# Patient Record
Sex: Male | Born: 1975 | Race: White | Hispanic: No | Marital: Married | Smoking: Never smoker
Health system: Southern US, Community
[De-identification: ages and names within clinical notes are randomized; demographics above are authoritative.]

## PROBLEM LIST (undated history)

## (undated) DIAGNOSIS — E66812 Obesity, class 2: Secondary | ICD-10-CM

## (undated) DIAGNOSIS — S332XXA Dislocation of sacroiliac and sacrococcygeal joint, initial encounter: Secondary | ICD-10-CM

## (undated) DIAGNOSIS — J300 Vasomotor rhinitis: Secondary | ICD-10-CM

## (undated) DIAGNOSIS — K219 Gastro-esophageal reflux disease without esophagitis: Secondary | ICD-10-CM

## (undated) DIAGNOSIS — K760 Fatty (change of) liver, not elsewhere classified: Secondary | ICD-10-CM

## (undated) DIAGNOSIS — R7401 Elevation of levels of liver transaminase levels: Secondary | ICD-10-CM

## (undated) DIAGNOSIS — I10 Essential (primary) hypertension: Secondary | ICD-10-CM

## (undated) DIAGNOSIS — N200 Calculus of kidney: Secondary | ICD-10-CM

## (undated) DIAGNOSIS — M109 Gout, unspecified: Secondary | ICD-10-CM

## (undated) DIAGNOSIS — E785 Hyperlipidemia, unspecified: Secondary | ICD-10-CM

## (undated) DIAGNOSIS — Z8371 Family history of colonic polyps: Secondary | ICD-10-CM

## (undated) DIAGNOSIS — E669 Obesity, unspecified: Secondary | ICD-10-CM

## (undated) DIAGNOSIS — R0789 Other chest pain: Secondary | ICD-10-CM

## (undated) DIAGNOSIS — R740 Nonspecific elevation of levels of transaminase and lactic acid dehydrogenase [LDH]: Secondary | ICD-10-CM

## (undated) DIAGNOSIS — R197 Diarrhea, unspecified: Secondary | ICD-10-CM

## (undated) HISTORY — DX: Vasomotor rhinitis: J30.0

## (undated) HISTORY — DX: Fatty (change of) liver, not elsewhere classified: K76.0

## (undated) HISTORY — DX: Diarrhea, unspecified: R19.7

## (undated) HISTORY — DX: Other chest pain: R07.89

## (undated) HISTORY — DX: Gout, unspecified: M10.9

## (undated) HISTORY — DX: Dislocation of sacroiliac and sacrococcygeal joint, initial encounter: S33.2XXA

## (undated) HISTORY — DX: Essential (primary) hypertension: I10

## (undated) HISTORY — DX: Hyperlipidemia, unspecified: E78.5

## (undated) HISTORY — DX: Obesity, class 2: E66.812

## (undated) HISTORY — DX: Obesity, unspecified: E66.9

## (undated) HISTORY — PX: EYE SURGERY: SHX253

## (undated) HISTORY — DX: Calculus of kidney: N20.0

## (undated) HISTORY — DX: Elevation of levels of liver transaminase levels: R74.01

## (undated) HISTORY — PX: COLONOSCOPY: SHX174

## (undated) HISTORY — DX: Gastro-esophageal reflux disease without esophagitis: K21.9

## (undated) HISTORY — PX: WISDOM TOOTH EXTRACTION: SHX21

## (undated) HISTORY — DX: Family history of colonic polyps: Z83.71

## (undated) HISTORY — DX: Nonspecific elevation of levels of transaminase and lactic acid dehydrogenase (ldh): R74.0

---
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
## 2009-01-23 ENCOUNTER — Emergency Department (HOSPITAL_COMMUNITY): Admission: EM | Admit: 2009-01-23 | Discharge: 2009-01-23 | Payer: Self-pay | Admitting: Emergency Medicine

## 2010-07-11 ENCOUNTER — Emergency Department (HOSPITAL_COMMUNITY): Admission: EM | Admit: 2010-07-11 | Discharge: 2010-05-04 | Payer: Self-pay | Admitting: Emergency Medicine

## 2010-10-17 LAB — URINALYSIS, ROUTINE W REFLEX MICROSCOPIC
Glucose, UA: NEGATIVE mg/dL
Hgb urine dipstick: NEGATIVE
Ketones, ur: NEGATIVE mg/dL
Nitrite: NEGATIVE
Protein, ur: NEGATIVE mg/dL
Specific Gravity, Urine: 1.021 (ref 1.005–1.030)
Urobilinogen, UA: 1 mg/dL (ref 0.0–1.0)
pH: 5 (ref 5.0–8.0)

## 2010-10-17 LAB — POCT I-STAT, CHEM 8
BUN: 16 mg/dL (ref 6–23)
Calcium, Ion: 1.23 mmol/L (ref 1.12–1.32)
Chloride: 107 mEq/L (ref 96–112)
Creatinine, Ser: 1.2 mg/dL (ref 0.4–1.5)
Glucose, Bld: 158 mg/dL — ABNORMAL HIGH (ref 70–99)
HCT: 48 % (ref 39.0–52.0)
Hemoglobin: 16.3 g/dL (ref 13.0–17.0)
Potassium: 4.2 mEq/L (ref 3.5–5.1)
Sodium: 140 mEq/L (ref 135–145)
TCO2: 28 mmol/L (ref 0–100)

## 2010-10-17 LAB — DIFFERENTIAL
Basophils Absolute: 0 10*3/uL (ref 0.0–0.1)
Basophils Relative: 0 % (ref 0–1)
Eosinophils Absolute: 0.3 10*3/uL (ref 0.0–0.7)
Eosinophils Relative: 3 % (ref 0–5)
Lymphocytes Relative: 27 % (ref 12–46)
Lymphs Abs: 2.7 10*3/uL (ref 0.7–4.0)
Monocytes Absolute: 0.7 10*3/uL (ref 0.1–1.0)
Monocytes Relative: 7 % (ref 3–12)
Neutro Abs: 6.3 10*3/uL (ref 1.7–7.7)
Neutrophils Relative %: 63 % (ref 43–77)

## 2010-10-17 LAB — CBC
HCT: 44 % (ref 39.0–52.0)
Hemoglobin: 15.9 g/dL (ref 13.0–17.0)
MCH: 30.5 pg (ref 26.0–34.0)
MCHC: 36.1 g/dL — ABNORMAL HIGH (ref 30.0–36.0)
MCV: 84.3 fL (ref 78.0–100.0)
Platelets: 168 10*3/uL (ref 150–400)
RBC: 5.22 MIL/uL (ref 4.22–5.81)
RDW: 12.9 % (ref 11.5–15.5)
WBC: 10 10*3/uL (ref 4.0–10.5)

## 2010-11-11 LAB — BASIC METABOLIC PANEL
BUN: 13 mg/dL (ref 6–23)
CO2: 28 mEq/L (ref 19–32)
Calcium: 9.2 mg/dL (ref 8.4–10.5)
Chloride: 105 mEq/L (ref 96–112)
Creatinine, Ser: 1.13 mg/dL (ref 0.4–1.5)
Glucose, Bld: 152 mg/dL — ABNORMAL HIGH (ref 70–99)
Potassium: 3.8 mEq/L (ref 3.5–5.1)
Sodium: 139 mEq/L (ref 135–145)

## 2010-11-11 LAB — DIFFERENTIAL
Basophils Absolute: 0 10*3/uL (ref 0.0–0.1)
Basophils Relative: 0 % (ref 0–1)
Eosinophils Absolute: 0.3 10*3/uL (ref 0.0–0.7)
Eosinophils Relative: 4 % (ref 0–5)
Lymphocytes Relative: 39 % (ref 12–46)
Lymphs Abs: 3.6 10*3/uL (ref 0.7–4.0)
Monocytes Absolute: 1 10*3/uL (ref 0.1–1.0)
Monocytes Relative: 10 % (ref 3–12)
Neutro Abs: 4.4 10*3/uL (ref 1.7–7.7)
Neutrophils Relative %: 47 % (ref 43–77)

## 2010-11-11 LAB — URINALYSIS, ROUTINE W REFLEX MICROSCOPIC
Bilirubin Urine: NEGATIVE
Glucose, UA: NEGATIVE mg/dL
Ketones, ur: NEGATIVE mg/dL
Leukocytes, UA: NEGATIVE
Nitrite: NEGATIVE
Protein, ur: NEGATIVE mg/dL
Specific Gravity, Urine: 1.03 (ref 1.005–1.030)
Urobilinogen, UA: 0.2 mg/dL (ref 0.0–1.0)
pH: 5.5 (ref 5.0–8.0)

## 2010-11-11 LAB — URINE MICROSCOPIC-ADD ON

## 2010-11-11 LAB — CBC
HCT: 45.7 % (ref 39.0–52.0)
Hemoglobin: 15.6 g/dL (ref 13.0–17.0)
MCHC: 34 g/dL (ref 30.0–36.0)
MCV: 86.8 fL (ref 78.0–100.0)
Platelets: 209 10*3/uL (ref 150–400)
RBC: 5.27 MIL/uL (ref 4.22–5.81)
RDW: 12.6 % (ref 11.5–15.5)
WBC: 9.3 10*3/uL (ref 4.0–10.5)

## 2011-08-30 ENCOUNTER — Ambulatory Visit (INDEPENDENT_AMBULATORY_CARE_PROVIDER_SITE_OTHER): Payer: BC Managed Care – PPO

## 2011-08-30 DIAGNOSIS — J019 Acute sinusitis, unspecified: Secondary | ICD-10-CM

## 2011-08-30 DIAGNOSIS — R05 Cough: Secondary | ICD-10-CM

## 2012-02-12 ENCOUNTER — Ambulatory Visit (INDEPENDENT_AMBULATORY_CARE_PROVIDER_SITE_OTHER): Payer: BC Managed Care – PPO | Admitting: Emergency Medicine

## 2012-02-12 VITALS — BP 142/90 | HR 77 | Temp 97.9°F | Resp 16 | Ht 71.0 in | Wt 231.2 lb

## 2012-02-12 DIAGNOSIS — R05 Cough: Secondary | ICD-10-CM

## 2012-02-12 DIAGNOSIS — J329 Chronic sinusitis, unspecified: Secondary | ICD-10-CM

## 2012-02-12 DIAGNOSIS — J019 Acute sinusitis, unspecified: Secondary | ICD-10-CM

## 2012-02-12 DIAGNOSIS — J029 Acute pharyngitis, unspecified: Secondary | ICD-10-CM

## 2012-02-12 LAB — POCT RAPID STREP A (OFFICE): Rapid Strep A Screen: NEGATIVE

## 2012-02-12 MED ORDER — AMOXICILLIN 875 MG PO TABS: 875.0000 mg | ORAL_TABLET | Freq: Two times a day (BID) | ORAL | Status: AC

## 2012-02-12 NOTE — Progress Notes (Signed)
  Subjective:    Patient ID: Hunter Wiley, male    DOB: 03-Oct-1975, 36 y.o.   MRN: [id number]  HPI patient presents with a two-day history of headache congestion sore throat cough. He denies any fever. No mucus he has grown from his nose has a color to it as well as the sputum he is producing discolored. Had no fever has no history of allergies. He has been prescribed nasal spray in the past but does not take it to    Review of Systems     Objective:   Physical Exam  Constitutional: He appears well-developed and well-nourished.  HENT:       There is purulent nasal drainage.Both TMs are scarred with significant tympanosclerosis  Eyes: Pupils are equal, round, and reactive to light.  Neck: No tracheal deviation present. No thyromegaly present.  Cardiovascular: Normal rate.   Pulmonary/Chest: No respiratory distress. He has no wheezes. He has no rales.  Abdominal: He exhibits no distension. There is no tenderness. There is no rebound.   Results for orders placed in visit on 02/12/12  POCT RAPID STREP A (OFFICE)      Component Value Range   Rapid Strep A Screen Negative  Negative         Assessment & Plan:  Strep test is negative we'll treat as a sinusitis with his purulent nasal drainage and productive cough.

## 2012-02-12 NOTE — Patient Instructions (Signed)

## 2012-04-21 ENCOUNTER — Ambulatory Visit (INDEPENDENT_AMBULATORY_CARE_PROVIDER_SITE_OTHER): Payer: BC Managed Care – PPO | Admitting: Internal Medicine

## 2012-04-21 ENCOUNTER — Encounter: Payer: Self-pay | Admitting: Internal Medicine

## 2012-04-21 VITALS — BP 132/84 | HR 96 | Temp 98.1°F | Resp 12 | Ht 72.3 in | Wt 236.8 lb

## 2012-04-21 DIAGNOSIS — Z23 Encounter for immunization: Secondary | ICD-10-CM

## 2012-04-21 DIAGNOSIS — Z0000 Encounter for general adult medical examination without abnormal findings: Secondary | ICD-10-CM

## 2012-04-21 DIAGNOSIS — Z136 Encounter for screening for cardiovascular disorders: Secondary | ICD-10-CM

## 2012-04-21 NOTE — Patient Instructions (Addendum)
Preventive Health Care: Exercise at least 30-45 minutes a day,  3-4 days a week.  Eat a low-fat diet with lots of fruits and vegetables, up to 7-9 servings per day.  Consume less than 40 grams of sugar per day from foods & drinks with High Fructose Corn Sugar as # 1,2,3 or # 4 on label. Use tissue to cleanse his stool; after bowel movements clean with TUCKS  or Baby Wipes. Sitz baths followed by the  Medication 2 to 3 times a day to shrink the hemorrhoids. Stay well hydrated and avoid popcorn and some other materials which might aggravate hemorrhoids.  The triggers for reflux  include stress; the "aspirin family" ; alcohol; peppermint; and caffeine (coffee, tea, cola, and chocolate). The aspirin family would include aspirin and the nonsteroidal agents such as ibuprofen &  Naproxen.Arthritis Strength Tylenol would not cause reflux. If having symptoms ; food & drink should be avoided for @ least 2 hours before going to bed. Prilosec OTC pre breakfast as needed. EKG is normal but there are minor ST-T changes of early repolarization. These are normal variants but could be mistaken for acute injury. This EKG should be available for comparison if  seen emergently.  Please  schedule fasting Labs : BMET,Lipids, hepatic panel, CBC & dif, TSH. PLEASE BRING THESE INSTRUCTIONS TO FOLLOW UP  LAB APPOINTMENT.This will guarantee correct labs are drawn, eliminating need for repeat blood sampling ( needle sticks ! ). Diagnoses /Codes: V70.0. If you activate My Chart; the results can be released to you as soon as they populate from the lab. If you choose not to use this program; the labs have to be reviewed, copied & mailed causing a delay in getting the results to you.

## 2012-04-21 NOTE — Progress Notes (Signed)
  Subjective:    Patient ID: Hunter Wiley, male    DOB: 02-Apr-1976, 36 y.o.   MRN: [id number]  HPI  Hunter Wiley is here for a physical;acute issues are delineated below     Review of Systems  For the last month he's noted intermittent "tingling" lasting seconds or less while at rest. There was no predisposition such as a respiratory tract infection or injury prior to onset of the symptoms. These are nonexertional; his only exertion is playing golf.  He denies significant chest pain, dyspnea, palpitations, edema, claudications, or paroxysmal nocturnal dyspnea.   He's been seen by [facility] for dislocation of the coccyx for which he takes Aleve several times a week.  His increase in dose of Aleve did precede this atypical chest sensation . He denies hoarseness, dysphagia, abdominal pain, unexplained weight loss, melena, or rectal bleeding.  He denies cough, sputum production, hemoptysis, or wheezing.     Objective:   Physical Exam  Gen.: Healthy and well-nourished in appearance. Alert, appropriate and cooperative throughout exam. Head: Normocephalic without obvious abnormalities  Eyes: No corneal or conjunctival inflammation noted. Pupils equal round reactive to light and accommodation. Fundal exam is benign without hemorrhages, exudate, papilledema. Extraocular motion intact.  Ears: External  ear exam reveals no significant lesions or deformities. Canals clear .TMs scarred. Hearing is grossly normal bilaterally. Nose: External nasal exam reveals no deformity or inflammation. Nasal mucosa are pink and moist. No lesions or exudates noted.  Mouth: Oral mucosa and oropharynx reveal no lesions or exudates. Teeth in good repair. Neck: No deformities, masses, or tenderness noted. Range of motion & Thyroid normal Lungs: Normal respiratory effort; chest expands symmetrically. Lungs are clear to auscultation without rales, wheezes, or increased work of breathing. Heart: Normal rate and rhythm.  Normal S1 and S2. No gallop, click, or rub. S4 w/o murmur. Abdomen: Bowel sounds normal; abdomen soft and nontender. No masses, organomegaly or hernias noted. Genitalia normal except for left varices.External hemorrhoids. Prostate is normal without enlargement, asymmetry, nodularity, or induration.  Musculoskeletal/extremities: No deformity or scoliosis noted of  the thoracic or lumbar spine. No clubbing, cyanosis, edema, or deformity noted. Range of motion  normal .Tone & strength  normal.Joints normal. Nail health  good. Vascular: Carotid, radial artery, dorsalis pedis and  posterior tibial pulses are full and equal. No bruits present. Neurologic: Alert and oriented x3. Deep tendon reflexes symmetrical and normal.          Skin: Intact without suspicious lesions or rashes. Lymph: No cervical, axillary, or inguinal lymphadenopathy present. Psych: Mood and affect are normal. Normally interactive                                                                                         Assessment & Plan:  #1 comprehensive physical exam; no acute findings #2 atypical chest symptoms are most likely due to reflux in the context of increased use of Aleve. If symptoms persist or progress; stress testing would be indicated Plan: see Orders

## 2012-05-04 ENCOUNTER — Other Ambulatory Visit (INDEPENDENT_AMBULATORY_CARE_PROVIDER_SITE_OTHER): Payer: BC Managed Care – PPO

## 2012-05-04 DIAGNOSIS — Z0000 Encounter for general adult medical examination without abnormal findings: Secondary | ICD-10-CM

## 2012-05-04 LAB — CBC WITH DIFFERENTIAL/PLATELET
Basophils Absolute: 0 10*3/uL (ref 0.0–0.1)
Basophils Relative: 0.4 % (ref 0.0–3.0)
Eosinophils Absolute: 0.2 10*3/uL (ref 0.0–0.7)
HCT: 44.2 % (ref 39.0–52.0)
Hemoglobin: 15.1 g/dL (ref 13.0–17.0)
Lymphocytes Relative: 27.4 % (ref 12.0–46.0)
Lymphs Abs: 2.2 10*3/uL (ref 0.7–4.0)
MCHC: 34.1 g/dL (ref 30.0–36.0)
MCV: 87.8 fl (ref 78.0–100.0)
Monocytes Absolute: 0.6 10*3/uL (ref 0.1–1.0)
Monocytes Relative: 8 % (ref 3.0–12.0)
Neutro Abs: 5 10*3/uL (ref 1.4–7.7)
Neutrophils Relative %: 61.7 % (ref 43.0–77.0)
RBC: 5.04 Mil/uL (ref 4.22–5.81)
RDW: 13.5 % (ref 11.5–14.6)

## 2012-05-04 LAB — BASIC METABOLIC PANEL
BUN: 16 mg/dL (ref 6–23)
CO2: 28 mEq/L (ref 19–32)
Calcium: 9.2 mg/dL (ref 8.4–10.5)
Chloride: 106 mEq/L (ref 96–112)
Creatinine, Ser: 1.1 mg/dL (ref 0.4–1.5)
GFR: 82.13 mL/min (ref >60.00)
Glucose, Bld: 92 mg/dL (ref 70–99)
Potassium: 4.3 mEq/L (ref 3.5–5.1)
Sodium: 139 mEq/L (ref 135–145)

## 2012-05-04 LAB — HEPATIC FUNCTION PANEL
ALT: 39 U/L (ref 0–53)
Albumin: 4.3 g/dL (ref 3.5–5.2)
Alkaline Phosphatase: 39 U/L (ref 39–117)
Bilirubin, Direct: 0.2 mg/dL (ref 0.0–0.3)
Total Bilirubin: 1.3 mg/dL — ABNORMAL HIGH (ref 0.3–1.2)
Total Protein: 7.2 g/dL (ref 6.0–8.3)

## 2012-05-04 LAB — LIPID PANEL
Cholesterol: 183 mg/dL (ref 0–200)
HDL: 36.1 mg/dL — ABNORMAL LOW (ref >39.00)
LDL Cholesterol: 118 mg/dL — ABNORMAL HIGH (ref 0–99)
Total CHOL/HDL Ratio: 5
VLDL: 29.2 mg/dL (ref 0.0–40.0)

## 2012-08-04 HISTORY — PX: VASECTOMY: SHX75

## 2013-03-22 ENCOUNTER — Encounter: Payer: Self-pay | Admitting: *Deleted

## 2013-03-22 ENCOUNTER — Emergency Department
Admission: EM | Admit: 2013-03-22 | Discharge: 2013-03-22 | Disposition: A | Discharge status: Home / Self Care | Payer: BC Managed Care – PPO | Source: Home / Self Care | Attending: Emergency Medicine | Admitting: Emergency Medicine

## 2013-03-22 DIAGNOSIS — R059 Cough, unspecified: Secondary | ICD-10-CM

## 2013-03-22 DIAGNOSIS — J45909 Unspecified asthma, uncomplicated: Secondary | ICD-10-CM

## 2013-03-22 DIAGNOSIS — R05 Cough: Secondary | ICD-10-CM

## 2013-03-22 MED ORDER — PROMETHAZINE-CODEINE 6.25-10 MG/5ML PO SYRP: ORAL_SOLUTION | ORAL | Status: DC

## 2013-03-22 MED ORDER — AZITHROMYCIN 250 MG PO TABS: ORAL_TABLET | ORAL | Status: DC

## 2013-03-22 MED ORDER — PREDNISONE 20 MG PO TABS: 20.0000 mg | ORAL_TABLET | Freq: Two times a day (BID) | ORAL | Status: DC

## 2013-03-22 NOTE — ED Provider Notes (Signed)
CSN: [id number]     Arrival date & time 03/22/13  4098 History     First MD Initiated Contact with Patient 03/22/13 1000     Chief Complaint  Patient presents with  . Cough    Patient is a 37 y.o. male presenting with cough. The history is provided by the patient.  Cough Cough characteristics:  Harsh, hacking and non-productive Severity:  Moderate (severe at bedtime) Duration:  6 days Timing:  Intermittent Progression:  Worsening Chronicity:  New (although he seems to get this one once a year around this time ) Smoker: no   Context: weather changes   Context: not animal exposure, not occupational exposure and not sick contacts   Relieved by:  Nothing Ineffective treatments: OTC cough med. Associated symptoms: rhinorrhea (minimal, serous) and wheezing (mild at times)   Associated symptoms: no chest pain, no chills, no ear pain, no fever, no headaches, no myalgias, no shortness of breath, no sinus congestion, no sore throat and no weight loss   Risk factors: no chemical exposure, no recent infection and no recent travel     Past Medical History  Diagnosis Date  . Dislocation, coccyx closed     [facility] [facility]   Past Surgical History  Procedure Laterality Date  . Tonsillectomy and adenoidectomy  1980  . Wisdom tooth extraction    . Vasectomy    . Eye surgery     Family History  Problem Relation Age of Onset  . Lung cancer Maternal Grandfather     mesothelioma  . Hypertension Mother   . Diabetes Mother   . Heart attack Mother     Normal coronaries @ cath  . Hypertension Father   . Heart attack Paternal Grandfather     in 102s   History  Substance Use Topics  . Smoking status: Never Smoker   . Smokeless tobacco: Not on file  . Alcohol Use: No    Review of Systems  Constitutional: Negative for fever, chills and weight loss.  HENT: Positive for rhinorrhea (minimal, serous). Negative for ear pain and sore throat.   Respiratory: Positive for cough and wheezing (mild at  times). Negative for shortness of breath.   Cardiovascular: Negative for chest pain.  Musculoskeletal: Negative for myalgias.  Neurological: Negative for headaches.  All other systems reviewed and are negative.    Allergies  Review of patient's allergies indicates no known allergies.  Home Medications   Current Outpatient Rx  Name  Route  Sig  Dispense  Refill  . azithromycin (ZITHROMAX Z-PAK) 250 MG tablet      Use as directed   1 each   0   . predniSONE (DELTASONE) 20 MG tablet   Oral   Take 1 tablet (20 mg total) by mouth 2 (two) times daily.   10 tablet   0   . promethazine-codeine (PHENERGAN WITH CODEINE) 6.25-10 MG/5ML syrup      Take 1-2 teaspoons every 4-6 hours as needed for cough. May cause drowsiness.   120 mL   0    BP 133/88  Pulse 62  Temp(Src) 97.9 F (36.6 C) (Oral)  Resp 18  Ht 5\' 11"  (1.803 m)  Wt 220 lb (99.791 kg)  BMI 30.7 kg/m2  SpO2 97% Physical Exam  Nursing note and vitals reviewed. Constitutional: He is oriented to person, place, and time. He appears well-developed and well-nourished. No distress.  HENT:  Head: Normocephalic and atraumatic.  Right Ear: Tympanic membrane normal.  Left Ear: Tympanic membrane  normal.  Nose: Nose normal.  Mouth/Throat: Oropharynx is clear and moist. No oropharyngeal exudate.  Eyes: Right eye exhibits no discharge. Left eye exhibits no discharge. No scleral icterus.  Neck: Neck supple.  Cardiovascular: Normal rate, regular rhythm and normal heart sounds.   Pulmonary/Chest: No accessory muscle usage. No respiratory distress. He has wheezes (mild wheezing and forced expiration). He has rhonchi. He has no rales. He exhibits no tenderness.  Hacking cough noted at times. No rhonchi  Good air movement  Lymphadenopathy:    He has no cervical adenopathy.  Neurological: He is alert and oriented to person, place, and time.  Skin: Skin is warm and dry.    ED Course   Procedures (including critical care  time)  Labs Reviewed - No data to display No results found. 1. Cough   2. Allergic bronchitis     MDM  Discussed above diagnosis. and treatment options. No evidence for infection at this time. After risk, benefits, alternativs discussed, he agrees with the following plans: Prednisone 20 mg PO BID times five days. Phenergan with codeine cough syrup, one or 2 teaspoons HS. Precautions and red flags discussed. If he develops productive cough or fever, go ahead and fill prescription for Zithromax Z Pak. Other symptomatic care discussed. Follow-up with PCP if not better one week, sooner if worse or new symptoms  Lajean Manes, MD 03/22/13 1711

## 2013-03-22 NOTE — ED Notes (Signed)
Pt c/o mostly non producitve cough x 5-6 days. Denies fever. No OTC meds.

## 2013-07-06 ENCOUNTER — Emergency Department (INDEPENDENT_AMBULATORY_CARE_PROVIDER_SITE_OTHER)
Admission: EM | Admit: 2013-07-06 | Discharge: 2013-07-06 | Disposition: A | Discharge status: Home / Self Care | Payer: BC Managed Care – PPO | Source: Home / Self Care | Attending: Family Medicine | Admitting: Family Medicine

## 2013-07-06 ENCOUNTER — Encounter: Payer: Self-pay | Admitting: Emergency Medicine

## 2013-07-06 DIAGNOSIS — J069 Acute upper respiratory infection, unspecified: Secondary | ICD-10-CM

## 2013-07-06 MED ORDER — AZITHROMYCIN 250 MG PO TABS: ORAL_TABLET | ORAL | Status: DC

## 2013-07-06 MED ORDER — BENZONATATE 200 MG PO CAPS: 200.0000 mg | ORAL_CAPSULE | Freq: Every day | ORAL | Status: DC

## 2013-07-06 NOTE — ED Notes (Signed)
Colm c/o nasal congestion and cough, productive @ times. Denies fever. No flu vaccine this year.

## 2013-07-06 NOTE — ED Provider Notes (Signed)
CSN: [id number]     Arrival date & time 07/06/13  0807 History   First MD Initiated Contact with Patient 07/06/13 [phone]     Chief Complaint  Patient presents with  . Nasal Congestion  . Cough      HPI Comments: Patient complains of 5 day history of scratchy throat, sinus congestion and myalgias.  He developed a productive cough last night.  The history is provided by the patient.    Past Medical History  Diagnosis Date  . Dislocation, coccyx closed     [facility]   Past Surgical History  Procedure Laterality Date  . Tonsillectomy and adenoidectomy  1980  . Wisdom tooth extraction    . Vasectomy    . Eye surgery     Family History  Problem Relation Age of Onset  . Lung cancer Maternal Grandfather     mesothelioma  . Hypertension Mother   . Diabetes Mother   . Heart attack Mother     Normal coronaries @ cath  . Hypertension Father   . Heart attack Paternal Grandfather     in 59s   History  Substance Use Topics  . Smoking status: Never Smoker   . Smokeless tobacco: Never Used  . Alcohol Use: No    Review of Systems + sore throat + cough No pleuritic pain No wheezing + nasal congestion + post-nasal drainage No sinus pain/pressure No itchy/red eyes No earache No hemoptysis No SOB No fever/chills No nausea No vomiting No abdominal pain No diarrhea No urinary symptoms No skin rash + fatigue No myalgias No headache Used OTC meds without relief  Allergies  Review of patient's allergies indicates no known allergies.  Home Medications   Current Outpatient Rx  Name  Route  Sig  Dispense  Refill  . azithromycin (ZITHROMAX Z-PAK) 250 MG tablet      Take 2 tabs today; then begin one tab once daily for 4 more days. (Rx void after 07/14/13)   6 each   0   . benzonatate (TESSALON) 200 MG capsule   Oral   Take 1 capsule (200 mg total) by mouth at bedtime. Take as needed for cough   12 capsule   0    BP 135/81  Pulse 81  Temp(Src) 97.6 F (36.4 C)  (Oral)  Resp 14  Ht 5\' 11"  (1.803 m)  Wt 233 lb (105.688 kg)  BMI 32.51 kg/m2  SpO2 99% Physical Exam Nursing notes and Vital Signs reviewed. Appearance:  Patient appears healthy, stated age, and in no acute distress Eyes:  Pupils are equal, round, and reactive to light and accomodation.  Extraocular movement is intact.  Conjunctivae are not inflamed  Ears:  Canals normal.  Tympanic membranes normal.  Nose:  Mildly congested turbinates.  No sinus tenderness.  Pharynx:  Normal Neck:  Supple.  Tender shotty posterior nodes are palpated bilaterally  Lungs:  Clear to auscultation.  Breath sounds are equal.  Heart:  Regular rate and rhythm without murmurs, rubs, or gallops.  Abdomen:  Nontender without masses or hepatosplenomegaly.  Bowel sounds are present.  No CVA or flank tenderness.  Extremities:  No edema.  No calf tenderness Skin:  No rash present.   ED Course  Procedures  none       MDM   1. Acute upper respiratory infections of unspecified site; suspect early viral URI    There is no evidence of bacterial infection today.   Treat symptomatically for now  Prescription written  for Benzonatate (Tessalon) to take at bedtime for night-time cough.  Take Mucinex D (1200mg  guaifenesin with decongestant) twice daily for congestion.  Increase fluid intake, rest. May use Afrin nasal spray (or generic oxymetazoline) twice daily for about 5 days.  Also recommend using saline nasal spray several times daily and saline nasal irrigation (AYR is a common brand)  Stop all antihistamines for now, and other non-prescription cough/cold preparations. Begin Azithromycin if not improving about one week or if persistent fever develops (Given a prescription to hold, with an expiration date)  Follow-up with family doctor if not improving about10 days.     Lattie Haw, MD 07/10/13 [phone]

## 2014-04-07 ENCOUNTER — Ambulatory Visit (INDEPENDENT_AMBULATORY_CARE_PROVIDER_SITE_OTHER): Payer: BC Managed Care – PPO | Admitting: Family Medicine

## 2014-04-07 ENCOUNTER — Encounter: Payer: Self-pay | Admitting: Family Medicine

## 2014-04-07 VITALS — BP 142/90 | HR 68 | Temp 97.9°F | Resp 18 | Ht 71.0 in | Wt 225.0 lb

## 2014-04-07 DIAGNOSIS — R209 Unspecified disturbances of skin sensation: Secondary | ICD-10-CM

## 2014-04-07 DIAGNOSIS — R202 Paresthesia of skin: Secondary | ICD-10-CM

## 2014-04-07 NOTE — Progress Notes (Signed)
Office Note 04/09/2014  CC:  Chief Complaint  Patient presents with  . Establish Care  . Arm Pain    Left arm numbness x 2 days   HPI:  Hunter Wiley is a 38 y.o. White male who is here to establish care. Patient's most recent primary MD: Dr. Alwyn Ren. Old records in EPIC/[facility] EMR were reviewed prior to or during today's visit.  Onset a few days ago of numb/strange feeling on underside of left upper arm.  It is intermittent.  No arm pain. NO jaw pain, no SOB.  No known trigger or odd positioning of left arm recently.   No neck pain.  Has distant hx of seeing chiropracter ( a decade ago?)--says something about a neck x-ray being abnormal but doesn't recall details.  No other areas of his body have felt this way. No exercise except golfing several days a week.  Last labs 05/2012 wnl.  Past Medical History  Diagnosis Date  . Dislocation, coccyx closed     [facility]  . Elevated blood pressure reading without diagnosis of hypertension   . Nephrolithiasis     Past Surgical History  Procedure Laterality Date  . Tonsillectomy and adenoidectomy  1980  . Wisdom tooth extraction    . Vasectomy    . Eye surgery      Lasik    Family History  Problem Relation Age of Onset  . Lung cancer Maternal Grandfather     mesothelioma  . Hypertension Mother   . Diabetes Mother   . Heart attack Mother     Normal coronaries @ cath  . Hypertension Father   . Heart attack Paternal Grandfather     in 33s    History   Social History  . Marital Status: Married    Spouse Name: N/A    Number of Children: N/A  . Years of Education: N/A   Occupational History  . Not on file.   Social History Main Topics  . Smoking status: Never Smoker   . Smokeless tobacco: Never Used  . Alcohol Use: No  . Drug Use: No  . Sexual Activity: Not on file   Other Topics Concern  . Not on file   Social History Narrative   Married, 2 sons.   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   Lives in [place].   No T/A/Ds.   No FH of prostate or colon cancer   MEDS: none  No Known Allergies  ROS Review of Systems  Constitutional: Negative for fever and fatigue.  HENT: Negative for congestion and sore throat.   Eyes: Negative for visual disturbance.  Respiratory: Negative for cough.   Cardiovascular: Negative for chest pain.  Gastrointestinal: Negative for nausea and abdominal pain.  Genitourinary: Negative for dysuria.  Musculoskeletal: Negative for back pain and joint swelling.  Skin: Negative for rash.  Neurological: Negative for weakness and headaches.  Hematological: Negative for adenopathy.    PE; Blood pressure 142/90, pulse 68, temperature 97.9 F (36.6 C), temperature source Temporal, resp. rate 18, height  (1.803 m), weight 225 lb (102.059 kg), SpO2 96.00%. Gen: Alert, well appearing.  Patient is oriented to person, place, time, and situation. ENT: Ears: EACs clear, normal epithelium.  TMs with good light reflex and landmarks bilaterally.  Eyes: no injection, icteris, swelling, or exudate.  EOMI, PERRLA. Nose: no drainage or turbinate edema/swelling.  No injection or focal lesion.  Mouth: lips without lesion/swelling.  Oral mucosa pink and moist.  Dentition intact and  without obvious caries or gingival swelling.  Oropharynx without erythema, exudate, or swelling.  Neck - No masses or thyromegaly or limitation in range of motion CV: RRR, no m/r/g.   LUNGS: CTA bilat, nonlabored resps, good aeration in all lung fields. EXT: no clubbing, cyanosis, or edema.  Neuro: CN 2-12 intact bilaterally, strength 5/5 in proximal and distal upper extremities and lower extremities bilaterally.  No sensory deficits (monofilament testing and testing with cotton swab done on upper extremeties..  No tremor.  No disdiadochokinesis.  No ataxia.  Upper extremity and lower extremity DTRs symmetric.  No pronator drift. Musculoskeletal: no joint swelling, erythema, warmth, or tenderness.  ROM of all  joints intact.  Spurling's negative.  Pertinent labs:  none  ASSESSMENT AND PLAN:   New pt; no old records to obtain.  1) Left arm focal numbness sensation intermittently x approx 3d. Normal exam today.  No red flags for intracranial pathology or CV dz. Reassured pt.  He may have a mild brachial plexus stretch-type injury from golfing. Signs/symptoms to call or return for were reviewed and pt expressed understanding.  An After Visit Summary was printed and given to the patient.  Return for return at your convenience for fasting CPE.

## 2014-04-07 NOTE — Progress Notes (Signed)
Pre visit review using our clinic review tool, if applicable. No additional management support is needed unless otherwise documented below in the visit note. 

## 2015-03-05 DIAGNOSIS — R197 Diarrhea, unspecified: Secondary | ICD-10-CM

## 2015-03-05 HISTORY — DX: Diarrhea, unspecified: R19.7

## 2015-03-15 LAB — HEPATIC FUNCTION PANEL
ALT: 60 U/L — AB (ref 10–40)
AST: 29 U/L (ref 14–40)
Alkaline Phosphatase: 44 U/L (ref 25–125)
Bilirubin, Total: 0.6 mg/dL

## 2015-03-15 LAB — BASIC METABOLIC PANEL
BUN: 15 mg/dL (ref 4–21)
CREATININE: 1.2 mg/dL (ref 0.6–1.3)
Glucose: 87 mg/dL
Potassium: 4.1 mmol/L (ref 3.4–5.3)
Sodium: 139 mmol/L (ref 137–147)

## 2015-03-15 LAB — CBC AND DIFFERENTIAL
HCT: 44 % (ref 41–53)
Hemoglobin: 15.1 g/dL (ref 13.5–17.5)
Neutrophils Absolute: 5 /uL
Platelets: 164 10*3/uL (ref 150–399)
WBC: 8.7 10^3/mL

## 2015-03-15 LAB — TSH: TSH: 2.45 u[IU]/mL (ref 0.41–5.90)

## 2015-03-18 ENCOUNTER — Encounter: Payer: Self-pay | Admitting: Family Medicine

## 2015-04-02 ENCOUNTER — Encounter: Payer: Self-pay | Admitting: Family Medicine

## 2015-09-07 ENCOUNTER — Encounter: Payer: Self-pay | Admitting: Family Medicine

## 2015-09-07 ENCOUNTER — Ambulatory Visit (INDEPENDENT_AMBULATORY_CARE_PROVIDER_SITE_OTHER): Payer: BC Managed Care – PPO | Admitting: Family Medicine

## 2015-09-07 VITALS — BP 148/112 | HR 74 | Temp 98.0°F | Resp 16 | Ht 71.0 in | Wt 244.5 lb

## 2015-09-07 DIAGNOSIS — I10 Essential (primary) hypertension: Secondary | ICD-10-CM | POA: Diagnosis not present

## 2015-09-07 DIAGNOSIS — Z114 Encounter for screening for human immunodeficiency virus [HIV]: Secondary | ICD-10-CM | POA: Diagnosis not present

## 2015-09-07 DIAGNOSIS — Z0000 Encounter for general adult medical examination without abnormal findings: Secondary | ICD-10-CM | POA: Diagnosis not present

## 2015-09-07 LAB — COMPREHENSIVE METABOLIC PANEL
ALK PHOS: 48 U/L (ref 39–117)
ALT: 54 U/L — ABNORMAL HIGH (ref 0–53)
AST: 31 U/L (ref 0–37)
Albumin: 4.6 g/dL (ref 3.5–5.2)
BILIRUBIN TOTAL: 1 mg/dL (ref 0.2–1.2)
BUN: 11 mg/dL (ref 6–23)
CO2: 32 meq/L (ref 19–32)
Calcium: 10 mg/dL (ref 8.4–10.5)
Chloride: 102 mEq/L (ref 96–112)
Creatinine, Ser: 1.07 mg/dL (ref 0.40–1.50)
GFR: 80.74 mL/min (ref >60.00)
Glucose, Bld: 83 mg/dL (ref 70–99)
Potassium: 4.2 mEq/L (ref 3.5–5.1)
Sodium: 140 mEq/L (ref 135–145)
Total Protein: 7.3 g/dL (ref 6.0–8.3)

## 2015-09-07 LAB — LIPID PANEL
CHOLESTEROL: 205 mg/dL — AB (ref 0–200)
HDL: 37.1 mg/dL — ABNORMAL LOW (ref >39.00)
LDL Cholesterol: 136 mg/dL — ABNORMAL HIGH (ref 0–99)
NonHDL: 167.86
Total CHOL/HDL Ratio: 6
Triglycerides: 157 mg/dL — ABNORMAL HIGH (ref 0.0–149.0)
VLDL: 31.4 mg/dL (ref 0.0–40.0)

## 2015-09-07 MED ORDER — IRBESARTAN 150 MG PO TABS: 150.0000 mg | ORAL_TABLET | Freq: Every day | ORAL | Status: DC

## 2015-09-07 NOTE — Progress Notes (Signed)
Office Note 09/07/2015  CC:  Chief Complaint  Patient presents with  . Annual Exam    Pt is fasting.    HPI:  Hunter Wiley is a 40 y.o. White male who is here for annual health maintenance exam. Voices that he has general/chronic worry about his heart.  Thus, when he has any sx's in left shoulder or arm he gets worried.   No exertional CP.  No diaphoresis or nausea. Plays golf and lifts a lot a work but no formal exercise. Diet: "regular american".    Says his bp has often been high in MD offices, no bp checks outside of office. Was on "a bp med from an UC MD for about 2 weeks once".   Past Medical History  Diagnosis Date  . Dislocation, coccyx closed     [facility]  . Elevated blood pressure reading without diagnosis of hypertension   . Nephrolithiasis   . Diarrhea 03/2015    GI eval (Dr. Dulce Sellar, [facility]) ?malabsorptive syndrome?  Work-up pending  . Family history of colonic polyps     father    Past Surgical History  Procedure Laterality Date  . Tonsillectomy and adenoidectomy  1980  . Wisdom tooth extraction    . Vasectomy  2014  . Eye surgery      Lasik    Family History  Problem Relation Age of Onset  . Lung cancer Maternal Grandfather     mesothelioma  . Hypertension Mother   . Diabetes Mother   . Heart attack Mother     Normal coronaries @ cath  . Hypertension Father   . Heart attack Paternal Grandfather     in 64s    Social History   Social History  . Marital Status: Married    Spouse Name: N/A  . Number of Children: N/A  . Years of Education: N/A   Occupational History  . Not on file.   Social History Main Topics  . Smoking status: Never Smoker   . Smokeless tobacco: Never Used  . Alcohol Use: No  . Drug Use: No  . Sexual Activity: Not on file   Other Topics Concern  . Not on file   Social History Narrative   Married, 2 sons.   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   Lives in [place].   No T/A/Ds.   No FH of prostate or colon  cancer    No outpatient prescriptions prior to visit.   No facility-administered medications prior to visit.    No Known Allergies  ROS Review of Systems  Constitutional: Negative for fever, chills, appetite change and fatigue.  HENT: Negative for congestion, dental problem, ear pain and sore throat.   Eyes: Negative for discharge, redness and visual disturbance.  Respiratory: Negative for cough, chest tightness, shortness of breath and wheezing.   Cardiovascular: Negative for chest pain, palpitations and leg swelling.  Gastrointestinal: Negative for nausea, vomiting, abdominal pain, diarrhea and blood in stool.  Genitourinary: Negative for dysuria, urgency, frequency, hematuria, flank pain and difficulty urinating.  Musculoskeletal: Negative for myalgias, back pain, joint swelling, arthralgias and neck stiffness.  Skin: Negative for pallor and rash.  Neurological: Negative for dizziness, speech difficulty, weakness and headaches.  Hematological: Negative for adenopathy. Does not bruise/bleed easily.  Psychiatric/Behavioral: Negative for confusion and sleep disturbance. The patient is not nervous/anxious.     PE; Blood pressure 148/112, pulse 74, temperature 98 F (36.7 C), temperature source Oral, resp. rate 16, height  (  1.803 m), weight 244 lb 8 oz (110.904 kg), SpO2 96 %. Repeat bp manually was 148/112. Gen: Alert, well appearing.  Patient is oriented to person, place, time, and situation. AFFECT: pleasant, lucid thought and speech. ENT: Ears: EACs clear, normal epithelium.  TMs with good light reflex and landmarks bilaterally.  Eyes: no injection, icteris, swelling, or exudate.  EOMI, PERRLA. Nose: no drainage or turbinate edema/swelling.  No injection or focal lesion.  Mouth: lips without lesion/swelling.  Oral mucosa pink and moist.  Dentition intact and without obvious caries or gingival swelling.  Oropharynx without erythema, exudate, or swelling.  Neck:  supple/nontender.  No LAD, mass, or TM.  Carotid pulses 2+ bilaterally, without bruits. CV: RRR, no m/r/g.   No chest wall tenderness. LUNGS: CTA bilat, nonlabored resps, good aeration in all lung fields. ABD: soft, NT, ND, BS normal.  No hepatospenomegaly or mass.  No bruits. EXT: no clubbing, cyanosis, or edema.  Musculoskeletal: no joint swelling, erythema, warmth, or tenderness.  ROM of all joints intact. Skin - no sores or suspicious lesions or rashes or color changes  Pertinent labs:   12 lead EKG today: NSR, rate 64, no ischemic changes, no hypertrophy, no ectopy.  Lab Results  Component Value Date   TSH 2.45 03/15/2015   Lab Results  Component Value Date   WBC 8.7 03/15/2015   HGB 15.1 03/15/2015   HCT 44 03/15/2015   MCV 87.8 05/04/2012   PLT 164 03/15/2015   Lab Results  Component Value Date   CREATININE 1.2 03/15/2015   BUN 15 03/15/2015   NA 139 03/15/2015   K 4.1 03/15/2015   CL 106 05/04/2012   CO2 28 05/04/2012   Lab Results  Component Value Date   ALT 60* 03/15/2015   AST 29 03/15/2015   ALKPHOS 44 03/15/2015   BILITOT 1.3* 05/04/2012   Lab Results  Component Value Date   CHOL 183 05/04/2012   Lab Results  Component Value Date   HDL 36.10* 05/04/2012   Lab Results  Component Value Date   LDLCALC 118* 05/04/2012   Lab Results  Component Value Date   TRIG 146.0 05/04/2012   Lab Results  Component Value Date   CHOLHDL 5 05/04/2012    ASSESSMENT AND PLAN:   1) Essential HTN; "new" dx.   EKG normal. Encouraged pt to monitor bp at home/get home bp cuff--check daily for now. Bring numbers in for review at next o/v in 2 weeks. Start irbesartan  qd today.    2) Health maintenance exam: Reviewed age and gender appropriate health maintenance issues (prudent diet, regular exercise, health risks of tobacco and excessive alcohol, use of seatbelts, fire alarms in home, use of sunscreen).  Also reviewed age and gender appropriate health  screening as well as vaccine recommendations. Tdap today.  He declined flu vaccine. HIV screening + fasting lipids and CMET today.  CBC and TSH normal 03/2015--no repeat needed at this time.  An After Visit Summary was printed and given to the patient.  FOLLOW UP:  Return in about 2 weeks (around 09/21/2015) for f/u HTN.

## 2015-09-07 NOTE — Progress Notes (Signed)
Pre visit review using our clinic review tool, if applicable. No additional management support is needed unless otherwise documented below in the visit note. 

## 2015-09-08 LAB — HIV ANTIBODY (ROUTINE TESTING W REFLEX): HIV: NONREACTIVE

## 2015-09-10 ENCOUNTER — Encounter: Payer: Self-pay | Admitting: Family Medicine

## 2015-09-19 ENCOUNTER — Ambulatory Visit (INDEPENDENT_AMBULATORY_CARE_PROVIDER_SITE_OTHER): Payer: BC Managed Care – PPO | Admitting: Family Medicine

## 2015-09-19 ENCOUNTER — Encounter: Payer: Self-pay | Admitting: Family Medicine

## 2015-09-19 VITALS — BP 149/92 | HR 68 | Temp 97.9°F | Resp 16 | Ht 71.0 in | Wt 249.0 lb

## 2015-09-19 DIAGNOSIS — Z23 Encounter for immunization: Secondary | ICD-10-CM | POA: Diagnosis not present

## 2015-09-19 DIAGNOSIS — I10 Essential (primary) hypertension: Secondary | ICD-10-CM | POA: Diagnosis not present

## 2015-09-19 MED ORDER — AMLODIPINE BESY-BENAZEPRIL HCL 10-40 MG PO CAPS: 1.0000 | ORAL_CAPSULE | Freq: Every day | ORAL | Status: DC

## 2015-09-19 NOTE — Progress Notes (Signed)
Pre visit review using our clinic review tool, if applicable. No additional management support is needed unless otherwise documented below in the visit note. 

## 2015-09-19 NOTE — Progress Notes (Signed)
OFFICE VISIT  09/19/2015   CC:  Chief Complaint  Patient presents with  . Follow-up    HTN. Pt is not fasting.    HPI:    Patient is a 40 y.o. Caucasian male who presents for 2 week f/u HTN. Tolerating med fine. Home bp checks still mostly stage 2 syst and diast, occ stage 1 reading, no normal readings. No HA, CP, SOB, palp's, dizziness, or LE edema.  Past Medical History  Diagnosis Date  . Dislocation, coccyx closed     [facility]  . HTN (hypertension)   . Nephrolithiasis   . Diarrhea 03/2015    GI eval (Dr. Dulce Sellar, [facility]) ?malabsorptive syndrome?  Work-up pending  . Family history of colonic polyps     father    Past Surgical History  Procedure Laterality Date  . Tonsillectomy and adenoidectomy  1980  . Wisdom tooth extraction    . Vasectomy  2014  . Eye surgery      Lasik    Outpatient Prescriptions Prior to Visit  Medication Sig Dispense Refill  . irbesartan (AVAPRO) 150 MG tablet Take 1 tablet (150 mg total) by mouth daily. 30 tablet 6   No facility-administered medications prior to visit.    No Known Allergies  ROS As per HPI  PE: Blood pressure 149/92, pulse 68, temperature 97.9 F (36.6 C), temperature source Oral, resp. rate 16, height  (1.803 m), weight 249 lb (112.946 kg), SpO2 96 %. Gen: Alert, well appearing.  Patient is oriented to person, place, time, and situation. CV: RRR, no m/r/g.   LUNGS: CTA bilat, nonlabored resps, good aeration in all lung fields. EXT: no clubbing, cyanosis, or edema.    LABS:  Lab Results  Component Value Date   CHOL 205* 09/07/2015   HDL 37.10* 09/07/2015   LDLCALC 136* 09/07/2015   TRIG 157.0* 09/07/2015   CHOLHDL 6 09/07/2015     Chemistry      Component Value Date/Time   NA 140 09/07/2015 1112   NA 139 03/15/2015   K 4.2 09/07/2015 1112   CL 102 09/07/2015 1112   CO2 32 09/07/2015 1112   BUN 11 09/07/2015 1112   BUN 15 03/15/2015   CREATININE 1.07 09/07/2015 1112   CREATININE 1.2  03/15/2015   GLU 87 03/15/2015      Component Value Date/Time   CALCIUM 10.0 09/07/2015 1112   ALKPHOS 48 09/07/2015 1112   AST 31 09/07/2015 1112   ALT 54* 09/07/2015 1112   BILITOT 1.0 09/07/2015 1112      IMPRESSION AND PLAN:  Essential HTN, not well controlled on irbesartan  qd trial x 2 wks. D/c irbesartan. Start lotrel 10/40, 1 qd.  Therapeutic expectations and side effect profile of medication discussed today.  Patient's questions answered. Continue home bp monitoring.   DASH diet handout given.  Of note, I forgot to give him Tdap at last visit---my note from that day says I gave this vaccine.  I went ahead and gave him this vaccine today.  FOLLOW UP: Return in about 4 weeks (around 10/17/2015) for f/u HTN.

## 2015-09-20 ENCOUNTER — Ambulatory Visit: Payer: BC Managed Care – PPO | Admitting: Family Medicine

## 2015-10-17 ENCOUNTER — Encounter: Payer: Self-pay | Admitting: Family Medicine

## 2015-10-17 ENCOUNTER — Ambulatory Visit (INDEPENDENT_AMBULATORY_CARE_PROVIDER_SITE_OTHER): Payer: BC Managed Care – PPO | Admitting: Family Medicine

## 2015-10-17 VITALS — BP 124/85 | HR 68 | Temp 97.9°F | Resp 16 | Ht 71.0 in | Wt 247.5 lb

## 2015-10-17 DIAGNOSIS — I10 Essential (primary) hypertension: Secondary | ICD-10-CM | POA: Diagnosis not present

## 2015-10-17 MED ORDER — AMLODIPINE BESY-BENAZEPRIL HCL 10-40 MG PO CAPS: 1.0000 | ORAL_CAPSULE | Freq: Every day | ORAL | Status: DC

## 2015-10-17 MED ORDER — HYDROCHLOROTHIAZIDE 25 MG PO TABS: 25.0000 mg | ORAL_TABLET | Freq: Every day | ORAL | Status: DC

## 2015-10-17 NOTE — Progress Notes (Signed)
Pre visit review using our clinic review tool, if applicable. No additional management support is needed unless otherwise documented below in the visit note. 

## 2015-10-17 NOTE — Progress Notes (Signed)
OFFICE VISIT  10/17/2015   CC:  Chief Complaint  Patient presents with  . Follow-up    HTN. Pt is not fasting.      HPI:    Patient is a 40 y.o. Caucasian male who presents for 1 mo f/u HTN. Changed pt to lotrel 10/40 last visit. Typically getting 140s/90s when checks at home.  No side effect from the med at all. ROS: no HAs, no CP, no dizziness, no vision c/o, no SOB, no focal weakness.  No LE edema.   Past Medical History  Diagnosis Date  . Dislocation, coccyx closed     [facility]  . HTN (hypertension)   . Nephrolithiasis   . Diarrhea 03/2015    GI [facility]) ?malabsorptive syndrome?  Work-up pending  . Family history of colonic polyps     father    Past Surgical History  Procedure Laterality Date  . Tonsillectomy and adenoidectomy  1980  . Wisdom tooth extraction    . Vasectomy  2014  . Eye surgery      Lasik    Outpatient Prescriptions Prior to Visit  Medication Sig Dispense Refill  . amLODipine-benazepril (LOTREL) 10-40 MG capsule Take 1 capsule by mouth daily. 30 capsule 1   No facility-administered medications prior to visit.    No Known Allergies  ROS As per HPI  PE: Blood pressure 124/85, pulse 68, temperature 97.9 F (36.6 C), temperature source Oral, resp. rate 16, height 5\' 11"  (1.803 m), weight 247 lb 8 oz (112.265 kg), SpO2 95 %. Gen: Alert, well appearing.  Patient is oriented to person, place, time, and situation. CV: RRR, no m/r/g.   LUNGS: CTA bilat, nonlabored resps, good aeration in all lung fields. EXT: no clubbing, cyanosis, or edema.    LABS:    Chemistry      Component Value Date/Time   NA 140 09/07/2015 1112   NA 139 03/15/2015   K 4.2 09/07/2015 1112   CL 102 09/07/2015 1112   CO2 32 09/07/2015 1112   BUN 11 09/07/2015 1112   BUN 15 03/15/2015   CREATININE 1.07 09/07/2015 1112   CREATININE 1.2 03/15/2015   GLU 87 03/15/2015      Component Value Date/Time   CALCIUM 10.0 09/07/2015 1112   ALKPHOS 48  09/07/2015 1112   AST 31 09/07/2015 1112   ALT 54* 09/07/2015 1112   BILITOT 1.0 09/07/2015 1112     IMPRESSION AND PLAN:  1) Uncontrolled HTN: we're close to getting him under control, but I want to add HCTZ 25mg  qd today. Therapeutic expectations and side effect profile of medication discussed today.  Patient's questions answered. Continue lotrel 10/40. Continue home bp monitoring. Check BMET at f/u in 3-4 wks.  An After Visit Summary was printed and given to the patient.  FOLLOW UP: Return for 3-4 weeks--f/u HTN.

## 2015-11-12 ENCOUNTER — Ambulatory Visit: Payer: BC Managed Care – PPO | Admitting: Family Medicine

## 2015-11-19 ENCOUNTER — Ambulatory Visit (INDEPENDENT_AMBULATORY_CARE_PROVIDER_SITE_OTHER): Payer: BC Managed Care – PPO | Admitting: Family Medicine

## 2015-11-19 ENCOUNTER — Encounter: Payer: Self-pay | Admitting: Family Medicine

## 2015-11-19 VITALS — BP 107/73 | HR 79 | Temp 98.0°F | Resp 16 | Ht 71.0 in | Wt 248.2 lb

## 2015-11-19 DIAGNOSIS — I10 Essential (primary) hypertension: Secondary | ICD-10-CM | POA: Diagnosis not present

## 2015-11-19 LAB — BASIC METABOLIC PANEL
BUN: 15 mg/dL (ref 6–23)
CHLORIDE: 100 meq/L (ref 96–112)
CO2: 28 mEq/L (ref 19–32)
Calcium: 9.5 mg/dL (ref 8.4–10.5)
Creatinine, Ser: 1.12 mg/dL (ref 0.40–1.50)
GFR: 76.52 mL/min (ref >60.00)
GLUCOSE: 96 mg/dL (ref 70–99)
POTASSIUM: 3.7 meq/L (ref 3.5–5.1)
Sodium: 138 mEq/L (ref 135–145)

## 2015-11-19 MED ORDER — IRBESARTAN 150 MG PO TABS: ORAL_TABLET | ORAL | Status: DC

## 2015-11-19 NOTE — Progress Notes (Signed)
Pre visit review using our clinic review tool, if applicable. No additional management support is needed unless otherwise documented below in the visit note. 

## 2015-11-19 NOTE — Progress Notes (Signed)
OFFICE VISIT  11/19/2015   CC:  Chief Complaint  Patient presents with  . Follow-up    HTN. Pt is not fasting.    HPI:    Patient is a 40 y.o. Caucasian male who presents for 1 mo f/u HTN. Last visit I added HCTZ 25mg  qd to his lotrel 10-40. BPs are in normal range consistently now.  Says he coughs all the time---dry.  Also with brief/sudden unprovoked periods of fatigue since being on lotrel. No CP, dizziness, or SOB.   Past Medical History  Diagnosis Date  . Dislocation, coccyx closed     [facility]  . HTN (hypertension)   . Nephrolithiasis   . Diarrhea 03/2015    GI eval ([facility]) ?malabsorptive syndrome?  Work-up pending  . Family history of colonic polyps     father    Past Surgical History  Procedure Laterality Date  . Tonsillectomy and adenoidectomy  1980  . Wisdom tooth extraction    . Vasectomy  2014  . Eye surgery      Lasik    Outpatient Prescriptions Prior to Visit  Medication Sig Dispense Refill  . hydrochlorothiazide (HYDRODIURIL) 25 MG tablet Take 1 tablet (25 mg total) by mouth daily. 30 tablet 1  . amLODipine-benazepril (LOTREL) 10-40 MG capsule Take 1 capsule by mouth daily. 30 capsule 11   No facility-administered medications prior to visit.    No Known Allergies  ROS As per HPI  PE: Blood pressure 107/73, pulse 79, temperature 98 F (36.7 C), temperature source Oral, resp. rate 16, height 5\' 11"  (1.803 m), weight 248 lb 4 oz (112.605 kg), SpO2 95 %. Gen: Alert, well appearing.  Patient is oriented to person, place, time, and situation. AFFECT: pleasant, lucid thought and speech. CV: RRR, no m/r/g.   LUNGS: CTA bilat, nonlabored resps, good aeration in all lung fields.  EXT: no clubbing, cyanosis, or edema.    LABS:  None today  IMPRESSION AND PLAN:  Essential HTN: doing well on hctz but having side effects from lotrel. D/C lotrel. Start irbesartan 150mg  qd and after 1 wk of daily bp monitoring if bp not <140/90  then increase to 2 of the 150mg  irbesartan tabs qd.  An After Visit Summary was printed and given to the patient.  FOLLOW UP: Return for 2-4 week f/u HTN.  Signed:  Santiago BumpersPhil Walther Sanagustin, MD           11/19/2015

## 2015-12-12 ENCOUNTER — Other Ambulatory Visit: Payer: Self-pay

## 2015-12-12 MED ORDER — HYDROCHLOROTHIAZIDE 25 MG PO TABS: 25.0000 mg | ORAL_TABLET | Freq: Every day | ORAL | Status: DC

## 2015-12-13 ENCOUNTER — Encounter: Payer: Self-pay | Admitting: Family Medicine

## 2015-12-13 ENCOUNTER — Ambulatory Visit (INDEPENDENT_AMBULATORY_CARE_PROVIDER_SITE_OTHER): Payer: BC Managed Care – PPO | Admitting: Family Medicine

## 2015-12-13 VITALS — BP 116/80 | HR 79 | Temp 98.2°F | Resp 16 | Ht 71.0 in | Wt 246.2 lb

## 2015-12-13 DIAGNOSIS — I10 Essential (primary) hypertension: Secondary | ICD-10-CM | POA: Diagnosis not present

## 2015-12-13 MED ORDER — HYDROCHLOROTHIAZIDE 25 MG PO TABS: 25.0000 mg | ORAL_TABLET | Freq: Every day | ORAL | Status: DC

## 2015-12-13 MED ORDER — IRBESARTAN 300 MG PO TABS: 300.0000 mg | ORAL_TABLET | Freq: Every day | ORAL | Status: DC

## 2015-12-13 NOTE — Progress Notes (Signed)
Pre visit review using our clinic review tool, if applicable. No additional management support is needed unless otherwise documented below in the visit note. 

## 2015-12-13 NOTE — Progress Notes (Signed)
OFFICE VISIT  12/13/2015   CC:  Chief Complaint  Patient presents with  . Follow-up    HTN. Pt is not fasting.      HPI:    Patient is a 40 y.o. Caucasian male who presents for 3 week f/u HTN. Compliant with irbesartan 150mg : 2 tabs qd AND HCTZ 25mg  qd.  Home bp's 150-160/100 when on only 1 irbesartan qd. 3-4 days ago he increased to 2 irbesartan qd and bp's have been better: 140s/90s.  Feeling good, no side effects from meds. No HAs, no CP, no palpitations, no SOB, no LE swelling.   Past Medical History  Diagnosis Date  . Dislocation, coccyx closed     [facility]  . HTN (hypertension)   . Nephrolithiasis   . Diarrhea 03/2015    GI eval ([facility]) ?malabsorptive syndrome?  Work-up pending  . Family history of colonic polyps     father    Past Surgical History  Procedure Laterality Date  . Tonsillectomy and adenoidectomy  1980  . Wisdom tooth extraction    . Vasectomy  2014  . Eye surgery      Lasik    Outpatient Prescriptions Prior to Visit  Medication Sig Dispense Refill  . hydrochlorothiazide (HYDRODIURIL) 25 MG tablet Take 1 tablet (25 mg total) by mouth daily. 30 tablet 1  . irbesartan (AVAPRO) 150 MG tablet 1-2 tabs po qd 60 tablet 1   No facility-administered medications prior to visit.    Allergies  Allergen Reactions  . Lotrel [Amlodipine Besy-Benazepril Hcl] Cough    Cough + sudden periods of severe fatigue    ROS As per HPI  PE: Blood pressure 116/80, pulse 79, temperature 98.2 F (36.8 C), temperature source Oral, resp. rate 16, height 5\' 11"  (1.803 m), weight 246 lb 4 oz (111.698 kg), SpO2 94 %. Gen: Alert, well appearing.  Patient is oriented to person, place, time, and situation. CV: RRR, no m/r/g.   LUNGS: CTA bilat, nonlabored resps, good aeration in all lung fields. EXT: no clubbing, cyanosis, or edema.    LABS:    Chemistry      Component Value Date/Time   NA 138 11/19/2015 1202   NA 139 03/15/2015   K 3.7  11/19/2015 1202   CL 100 11/19/2015 1202   CO2 28 11/19/2015 1202   BUN 15 11/19/2015 1202   BUN 15 03/15/2015   CREATININE 1.12 11/19/2015 1202   CREATININE 1.2 03/15/2015   GLU 87 03/15/2015      Component Value Date/Time   CALCIUM 9.5 11/19/2015 1202   ALKPHOS 48 09/07/2015 1112   AST 31 09/07/2015 1112   ALT 54* 09/07/2015 1112   BILITOT 1.0 09/07/2015 1112       IMPRESSION AND PLAN:  Essential HTN: improved but not quite at goal. Discussed options today and he chose to give the meds longer rather than add another bp med today. I am fine with this choice.  He'll continue home bp monitoring and call or return if problems. RF'd meds today.  An After Visit Summary was printed and given to the patient.  FOLLOW UP: Return in about 3 months (around 03/14/2016) for annual CPE (fasting).  Signed:  Santiago BumpersPhil McGowen, MD           12/13/2015

## 2016-01-01 ENCOUNTER — Telehealth: Payer: Self-pay | Admitting: *Deleted

## 2016-01-01 NOTE — Telephone Encounter (Signed)
Pt LMOM on 01/01/16 at 2:49pm requesting a call back in regards to his BP medication.   I spoke to pt and he stated that his last Rx for irbesartan was written for 300mg  take 1-2 tab daily but new Rx that was sent in was for 300mg  take one daily. I reviewed pts chart and advised him that our records showed that he was to take 150mg  1-2 tab daily not 300mg  1-2 daily. He stated that the pharmacy wrote this Rx for 300mg  take 1-2 daily and that is how he has been taking it. I advised pt that I would need to call the pharmacy to confirm how they last filled this medication and also I would need to speak to Dr. Milinda CaveMcGowen. Pt voiced understanding.  I spoke to [place] at [facility] [address] and she confirmed that they were filling pts Rx wrong, that Rx was written for 150mg  take 1-2 daily but was filled for 300mg  take 1-2 daily. I advised her to hold off any futher refills until I speak to Dr. Milinda CaveMcGowen. She voiced understanding.  I spoke to Dr. Milinda CaveMcGowen who stated to have pt go back to just taking one 300mg  tablet daily and advise him that this was a pharmacy error. Pt was advised and voiced understanding. I advised [facility]ie [facility] and she voiced understanding.

## 2016-03-13 ENCOUNTER — Ambulatory Visit: Payer: BC Managed Care – PPO | Admitting: Family Medicine

## 2016-05-28 ENCOUNTER — Encounter: Payer: Self-pay | Admitting: Family Medicine

## 2016-05-28 ENCOUNTER — Ambulatory Visit (INDEPENDENT_AMBULATORY_CARE_PROVIDER_SITE_OTHER): Payer: BC Managed Care – PPO | Admitting: Family Medicine

## 2016-05-28 VITALS — BP 110/77 | HR 83 | Temp 98.3°F | Resp 16 | Wt 224.8 lb

## 2016-05-28 DIAGNOSIS — N138 Other obstructive and reflux uropathy: Secondary | ICD-10-CM | POA: Diagnosis not present

## 2016-05-28 DIAGNOSIS — N401 Enlarged prostate with lower urinary tract symptoms: Secondary | ICD-10-CM | POA: Diagnosis not present

## 2016-05-28 NOTE — Progress Notes (Signed)
OFFICE VISIT  05/28/2016   CC:  Chief Complaint  Patient presents with  . Follow-up    problems urinating   HPI:    Patient is a 40 y.o. Caucasian male who presents for "problems urinating".  Has been happening for about a year now, came on insidiously.  No recent acute worsening. Weak urine flow---after he gets urine flowing it seems to "take forever".  No dribbling and no sense of incomplete emptying.  No dysuria or urinary frequency.  No nocturia.  No hematuria.  The problem happens every time he goes to urinate. Never been evaluated for this before, no med tx before.    ROS: no fever, no foul urine smell, no incontinence.    Past Medical History:  Diagnosis Date  . Diarrhea 03/2015   GI [facility]) ?malabsorptive syndrome?  Work-up pending  . Dislocation, coccyx closed    [facility]  . Family history of colonic polyps    father  . HTN (hypertension)   . Nephrolithiasis     Past Surgical History:  Procedure Laterality Date  . EYE SURGERY     [facility]  . TONSILLECTOMY AND ADENOIDECTOMY  1980  . VASECTOMY  2014  . WISDOM TOOTH EXTRACTION      Outpatient Medications Prior to Visit  Medication Sig Dispense Refill  . hydrochlorothiazide (HYDRODIURIL) 25 MG tablet Take 1 tablet (25 mg total) by mouth daily. 90 tablet 3  . irbesartan (AVAPRO) 300 MG tablet Take 1 tablet (300 mg total) by mouth daily. 90 tablet 3   No facility-administered medications prior to visit.     Allergies  Allergen Reactions  . Lotrel [Amlodipine Besy-Benazepril Hcl] Cough    Cough + sudden periods of severe fatigue    ROS As per HPI  PE: Blood pressure 110/77, pulse 83, temperature 98.3 F (36.8 C), temperature source Temporal, resp. rate 16, weight 224 lb 12.8 oz (102 kg), SpO2 97 %. Gen: Alert, well appearing.  Patient is oriented to person, place, time, and situation. AFFECT: pleasant, lucid thought and speech. Genitals normal; both testes normal without tenderness,  masses, hydroceles, varicoceles, erythema or swelling. Shaft normal, circumcised, meatus normal without discharge. No inguinal hernia noted. No inguinal lymphadenopathy. Rectal exam: negative without mass, lesions or tenderness, PROSTATE EXAM: smooth and symmetric without nodules or tenderness.  Not grossly enlarged.     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  I even offered a referral to urology. We discussed things and decided to take a watchful waiting approach. Signs/symptoms to call or return for were reviewed and pt expressed understanding.  An After Visit Summary was printed and given to the patient.  FOLLOW UP: Return if symptoms worsen or fail to improve.  Signed:  Santiago BumpersPhil Ilyssa Grennan, MD           05/28/2016

## 2016-12-24 ENCOUNTER — Other Ambulatory Visit: Payer: Self-pay | Admitting: Family Medicine

## 2017-01-14 ENCOUNTER — Other Ambulatory Visit: Payer: Self-pay | Admitting: *Deleted

## 2017-01-14 MED ORDER — HYDROCHLOROTHIAZIDE 25 MG PO TABS: 25.0000 mg | ORAL_TABLET | Freq: Every day | ORAL | 0 refills | Status: DC

## 2017-01-14 NOTE — Telephone Encounter (Signed)
[facility] [address]    RF request for hctz LOV: 12/13/15  Next ov: None Last written: 12/13/15 #90 w/ 0RF  Will send Rx for #90 w/ 0RF. Pt is over due for f/u [facility], needs office visit for follow up for more refills.

## 2017-01-14 NOTE — Telephone Encounter (Signed)
Left message for pt to call back  °

## 2017-01-20 NOTE — Telephone Encounter (Signed)
Pt advised and voiced understanding.  Apt made for 02/10/17 at 11:00am.

## 2017-02-10 ENCOUNTER — Ambulatory Visit (INDEPENDENT_AMBULATORY_CARE_PROVIDER_SITE_OTHER): Payer: BC Managed Care – PPO | Admitting: Family Medicine

## 2017-02-10 ENCOUNTER — Encounter: Payer: Self-pay | Admitting: Family Medicine

## 2017-02-10 VITALS — BP 108/70 | HR 83 | Temp 97.9°F | Resp 16 | Ht 71.0 in | Wt 230.5 lb

## 2017-02-10 DIAGNOSIS — I10 Essential (primary) hypertension: Secondary | ICD-10-CM

## 2017-02-10 LAB — BASIC METABOLIC PANEL
BUN: 12 mg/dL (ref 6–23)
CO2: 28 mEq/L (ref 19–32)
CREATININE: 1.22 mg/dL (ref 0.40–1.50)
Calcium: 9.4 mg/dL (ref 8.4–10.5)
Chloride: 102 mEq/L (ref 96–112)
GFR: 68.92 mL/min (ref >60.00)
Glucose, Bld: 89 mg/dL (ref 70–99)
POTASSIUM: 3.6 meq/L (ref 3.5–5.1)
Sodium: 139 mEq/L (ref 135–145)

## 2017-02-10 NOTE — Progress Notes (Signed)
OFFICE VISIT  02/10/2017   CC:  Chief Complaint  Patient presents with  . Follow-up    RCI, pt is not fasting.    HPI:    Patient is a 41 y.o. Caucasian male who presents for f/u HTN. I last saw him for this over a year ago.  HTN: no home monitoring.  Compliant with med daily.   Exercise and diet: "doing a bit of each".  Was placed on Trokendi by his wt loss clinic and was taken off the phentermine per their protocol.  No side effects from Trokendi.  He got a DOT CPE at an occupational health clinic 09/2016.  ROS: occ random tingling and numbness in extremities--often just one leg or one arm, sometimes several at once---lasts less than 1 min and seems to be positional (sitting) and resolves with standing.   Past Medical History:  Diagnosis Date  . Diarrhea 03/2015   GI eval ([facility]) ?malabsorptive syndrome?  Work-up pending  . Dislocation, coccyx closed    [facility]  . Family history of colonic polyps    father  . HTN (hypertension)   . Nephrolithiasis     Past Surgical History:  Procedure Laterality Date  . EYE SURGERY     Lasik  . TONSILLECTOMY AND ADENOIDECTOMY  1980  . VASECTOMY  2014  . WISDOM TOOTH EXTRACTION     Social History   Social History  . Marital status: Married    Spouse name: N/A  . Number of children: N/A  . Years of education: N/A   Social History Main Topics  . Smoking status: Never Smoker  . Smokeless tobacco: Never Used  . Alcohol use No  . Drug use: No  . Sexual activity: Not Asked   Other Topics Concern  . None   Social History Narrative   Married, 2 sons.   Occupation: Designer, jewelleryowner of food service company.   [place]   No T/A/Ds.   No FH of prostate or colon cancer   Outpatient Medications Prior to Visit  Medication Sig Dispense Refill  . hydrochlorothiazide (HYDRODIURIL) 25 MG tablet Take 1 tablet (25 mg total) by mouth daily. 90 tablet 0  . irbesartan (AVAPRO) 300 MG tablet TAKE 1 TABLET EVERY DAY 90 tablet  0  . phentermine (ADIPEX-P) 37.5 MG tablet Take 37.5 mg by mouth daily.  0   No facility-administered medications prior to visit.     Allergies  Allergen Reactions  . Lotrel [Amlodipine Besy-Benazepril Hcl] Cough    Cough + sudden periods of severe fatigue    ROS As per HPI  PE: Blood pressure 108/70, pulse 83, temperature 97.9 F (36.6 C), temperature source Oral, resp. rate 16, height 5\' 11"  (1.803 m), weight 230 lb 8 oz (104.6 kg), SpO2 97 %. Gen: Alert, well appearing.  Patient is oriented to person, place, time, and situation. AFFECT: pleasant, lucid thought and speech. CV: RRR, no m/r/g.   LUNGS: CTA bilat, nonlabored resps, good aeration in all lung fields. EXT: no clubbing, cyanosis, or edema.   LABS:    Chemistry      Component Value Date/Time   NA 138 11/19/2015 1202   NA 139 03/15/2015   K 3.7 11/19/2015 1202   CL 100 11/19/2015 1202   CO2 28 11/19/2015 1202   BUN 15 11/19/2015 1202   BUN 15 03/15/2015   CREATININE 1.12 11/19/2015 1202   GLU 87 03/15/2015      Component Value Date/Time  CALCIUM 9.5 11/19/2015 1202   ALKPHOS 48 09/07/2015 1112   AST 31 09/07/2015 1112   ALT 54 (H) 09/07/2015 1112   BILITOT 1.0 09/07/2015 1112     Lab Results  Component Value Date   CHOL 205 (H) 09/07/2015   HDL 37.10 (L) 09/07/2015   LDLCALC 136 (H) 09/07/2015   TRIG 157.0 (H) 09/07/2015   CHOLHDL 6 09/07/2015   IMPRESSION AND PLAN:  1) HTN; The current medical regimen is effective;  continue present plan and medications. BMET today. Would like to get lipid panel at next visit if he is fasting.  Pt is not fasting today.  An After Visit Summary was printed and given to the patient.  FOLLOW UP: Return in about 1 year (around 02/10/2018) for f/u HTN.  Signed:  Santiago Bumpers, MD           02/10/2017

## 2017-04-03 ENCOUNTER — Other Ambulatory Visit: Payer: Self-pay | Admitting: Family Medicine

## 2017-04-10 ENCOUNTER — Other Ambulatory Visit: Payer: Self-pay | Admitting: Family Medicine

## 2017-04-10 NOTE — Telephone Encounter (Signed)
[facility] [address] 

## 2017-07-02 ENCOUNTER — Ambulatory Visit: Payer: BC Managed Care – PPO | Admitting: Family Medicine

## 2017-08-08 ENCOUNTER — Ambulatory Visit: Payer: BC Managed Care – PPO | Admitting: Family Medicine

## 2017-08-10 ENCOUNTER — Encounter: Payer: Self-pay | Admitting: Family Medicine

## 2017-08-10 ENCOUNTER — Telehealth: Payer: Self-pay

## 2017-08-10 ENCOUNTER — Ambulatory Visit: Payer: BC Managed Care – PPO | Admitting: Family Medicine

## 2017-08-10 VITALS — BP 137/83 | HR 81 | Temp 98.2°F | Resp 16 | Wt 242.0 lb

## 2017-08-10 DIAGNOSIS — N200 Calculus of kidney: Secondary | ICD-10-CM

## 2017-08-10 DIAGNOSIS — R3915 Urgency of urination: Secondary | ICD-10-CM

## 2017-08-10 DIAGNOSIS — R350 Frequency of micturition: Secondary | ICD-10-CM

## 2017-08-10 LAB — POCT URINALYSIS DIPSTICK
Bilirubin, UA: NEGATIVE
Blood, UA: NEGATIVE
Glucose, UA: NEGATIVE
Ketones, UA: NEGATIVE
Leukocytes, UA: NEGATIVE
Nitrite, UA: NEGATIVE
Protein, UA: NEGATIVE
Spec Grav, UA: 1.025 (ref 1.010–1.025)
Urobilinogen, UA: 0.2 E.U./dL
pH, UA: 5.5 (ref 5.0–8.0)

## 2017-08-10 NOTE — Telephone Encounter (Signed)
[facility] - Client TELEPHONE ADVICE RECORD [facility] Patient Name: Hunter Wiley Gender: Male DOB: [birth year] Age: 42 Y 5 M 7 D Return Phone Number: [phone] (Primary), [phone] (Secondary) Address: [place]: [place] [id number] Client [facility] - Client Client Site [facility] Physician Hunter Wiley, Hunter Wiley Contact Type Call Who Is Calling Patient / Member / Family / Caregiver Call Type Triage / Clinical Relationship To Patient Self Return Phone Number [phone] (Primary) Chief Complaint Urination Frequency Reason for Call Symptomatic / Request for Health Information Initial Comment Caller states that he is having trouble urinating, feels like bladder isn't emptying all the way and feels like he has to go more often. Translation No Nurse Assessment Nurse: Hunter Busmanalton, RN, Hunter Wiley Date/Time (Eastern Time): 08/07/2017 7:33:20 PM Confirm and document reason for call. If symptomatic, describe symptoms. ---Caller states he had pain last weekend and believes he passed kidney stone. He has been urinating more often this week and today he feels he needs to go every 10 minutes. When he does urinate, he states very little comes out. Does the patient have any new or worsening symptoms? ---Yes Will a triage be completed? ---Yes Related visit to physician within the last 2 weeks? ---No Does the PT have any chronic conditions? (i.e. diabetes, asthma, etc.) ---Yes List chronic conditions. ---hypertension Is this a behavioral health or substance abuse call? ---No Guidelines Guideline Title Affirmed Question Affirmed Notes Nurse Date/Time (Eastern Time) Urinary Symptoms Urinating more frequently than usual (i.e., frequency) Dalton, RN, Hunter Wiley 08/07/2017 7:37:49 PM Disp. Time Hunter Wiley(Eastern Time) Disposition Final User 08/07/2017 7:49:37 PM See Physician within 24 Hours Yes Hunter Busmanalton, RN,  Hunter Wiley Caller Disagree/Comply Comply PLEASE NOTE: All timestamps contained within this report are represented as [place] Standard Time. CONFIDENTIALTY NOTICE: This fax transmission is intended only for the addressee. It contains information that is legally privileged, confidential or otherwise protected from use or disclosure. If you are not the intended recipient, you are strictly prohibited from reviewing, disclosing, copying using or disseminating any of this information or taking any action in reliance on or regarding this information. If you have received this fax in error, please notify us immediately by telephone so that we can arrange for its return to us. Phone: [phone], Toll-Free: [phone], Fax: [phone] Page: 2 of 2 Call Id: [id number] Caller Understands Yes PreDisposition Home Care Care Advice Given Per Guideline SEE PHYSICIAN WITHIN 24 HOURS: CALL BACK IF: * Fever occurs * Unable to urinate and bladder feels full * You become worse. CARE ADVICE given per Urinary Symptoms (Adult) guideline. * IF OFFICE WILL BE CLOSED AND NO PCP TRIAGE: You need to be seen within the next 24 hours. An urgent care center is often a good source of care if your doctor's office is closed. Referrals GO TO FACILITY UNDECIDED

## 2017-08-10 NOTE — Progress Notes (Signed)
OFFICE VISIT  08/10/2017   CC:  Chief Complaint  Patient presents with  . Urinary Frequency    HPI:    Patient is a 42 y.o. Caucasian male who presents for urinary urgency and frequency. About 9 d/a he woke up with some lower abd pain, somewhat on R suprapubic region. Felt like a kidney stone like it had in the past.  Not long after he urinated and the pain resolved. After this happened he continued to have urinary urgency and frequency.  Made appt to see me 3 days ago, then last couple days all symptoms resolved.  No fevers. He has never had to have a stone extracted or shock-waved.  He has not seen a urologist for his stones in the past.  Feeling completely well now.  Past Medical History:  Diagnosis Date  . Diarrhea 03/2015   GI eval ([facility]) ?malabsorptive syndrome?  Work-up pending  . Dislocation, coccyx closed    [facility]  . Family history of colonic polyps    father  . HTN (hypertension)   . Nephrolithiasis     Past Surgical History:  Procedure Laterality Date  . EYE SURGERY     Lasik  . TONSILLECTOMY AND ADENOIDECTOMY  1980  . VASECTOMY  2014  . WISDOM TOOTH EXTRACTION      Outpatient Medications Prior to Visit  Medication Sig Dispense Refill  . hydrochlorothiazide (HYDRODIURIL) 25 MG tablet TAKE 1 TABLET BY MOUTH EVERY DAY 90 tablet 1  . irbesartan (AVAPRO) 300 MG tablet TAKE 1 TABLET BY MOUTH EVERY DAY 90 tablet 1  . TROKENDI XR 50 MG CP24 Take 1 capsule by mouth daily.     No facility-administered medications prior to visit.     Allergies  Allergen Reactions  . Lotrel [Amlodipine Besy-Benazepril Hcl] Cough    Cough + sudden periods of severe fatigue    ROS As per HPI  PE: Blood pressure 137/83, pulse 81, temperature 98.2 F (36.8 C), temperature source Oral, resp. rate 16, weight 242 lb (109.8 kg), SpO2 95 %. Gen: Alert, well appearing.  Patient is oriented to person, place, time, and situation. AFFECT: pleasant, lucid thought  and speech. AVW:UJWJENT:Eyes: no injection, icteris, swelling, or exudate.  EOMI, PERRLA. Mouth: lips without lesion/swelling.  Oral mucosa pink and moist. Oropharynx without erythema, exudate, or swelling.  CV: RRR, no m/r/g.   LUNGS: CTA bilat, nonlabored resps, good aeration in all lung fields. ABD: soft, NT, ND, BS normal.  No hepatospenomegaly or mass.  No bruits.   LABS:    CC UA today: completely normal.  IMPRESSION AND PLAN:  Nephrolithiasis, suspect recent passing of small stone. Some urinary tract irritation after this was causing some irritative urgency/frequency. This has completely resolved. Reassured. Signs/symptoms to call or return for were reviewed and pt expressed understanding.  An After Visit Summary was printed and given to the patient.   FOLLOW UP: Return for as needed.  Signed:  Santiago BumpersPhil Jalen Oberry, MD           08/10/2017

## 2017-08-10 NOTE — Telephone Encounter (Signed)
Noted  

## 2017-09-30 ENCOUNTER — Encounter: Payer: Self-pay | Admitting: Family

## 2017-09-30 ENCOUNTER — Ambulatory Visit: Payer: BC Managed Care – PPO | Admitting: Family

## 2017-09-30 VITALS — BP 138/82 | HR 84 | Temp 98.2°F | Ht 71.0 in | Wt 249.1 lb

## 2017-09-30 DIAGNOSIS — J019 Acute sinusitis, unspecified: Secondary | ICD-10-CM | POA: Diagnosis not present

## 2017-09-30 MED ORDER — FLUTICASONE PROPIONATE 50 MCG/ACT NA SUSP: 2.0000 | Freq: Every day | NASAL | 6 refills | Status: DC

## 2017-09-30 MED ORDER — AMOXICILLIN-POT CLAVULANATE 875-125 MG PO TABS: 1.0000 | ORAL_TABLET | Freq: Two times a day (BID) | ORAL | 0 refills | Status: DC

## 2017-09-30 NOTE — Progress Notes (Signed)
Hunter Wiley is a 42 y.o. male with the following history as recorded in EpicCare:  Patient Active Problem List   Diagnosis Date Noted  . Essential hypertension 11/19/2015    Current Outpatient Medications  Medication Sig Dispense Refill  . amoxicillin-clavulanate (AUGMENTIN) 875-125 MG tablet Take 1 tablet by mouth 2 (two) times daily. 20 tablet 0  . fluticasone (FLONASE) 50 MCG/ACT nasal spray Place 2 sprays into both nostrils daily. 16 g 6  . hydrochlorothiazide (HYDRODIURIL) 25 MG tablet TAKE 1 TABLET BY MOUTH EVERY DAY 90 tablet 1  . irbesartan (AVAPRO) 300 MG tablet TAKE 1 TABLET BY MOUTH EVERY DAY 90 tablet 1   No current facility-administered medications for this visit.     Allergies: Lotrel [amlodipine besy-benazepril hcl]  Past Medical History:  Diagnosis Date  . Diarrhea 03/2015   GI eval ([facility]) ?malabsorptive syndrome?  Work-up pending  . Dislocation, coccyx closed    [facility]  . Family history of colonic polyps    father  . HTN (hypertension)   . Nephrolithiasis     Past Surgical History:  Procedure Laterality Date  . EYE SURGERY     Lasik  . TONSILLECTOMY AND ADENOIDECTOMY  1980  . VASECTOMY  2014  . WISDOM TOOTH EXTRACTION      Family History  Problem Relation Age of Onset  . Hypertension Mother   . Diabetes Mother   . Heart attack Mother        Normal coronaries @ cath  . Hypertension Father   . Lung cancer Maternal Grandfather        mesothelioma  . Heart attack Paternal Grandfather        in 7550s    Social History   Tobacco Use  . Smoking status: Never Smoker  . Smokeless tobacco: Never Used  Substance Use Topics  . Alcohol use: No    Subjective:  Patient presents with concerns for possible sinus infection; + bilateral ear pain; + sore throat; prone to sinus infections; denies any chest pain or shortness of breath; not currently taking any OTC medications;   Objective:  Vitals:   09/30/17 1358  BP: 138/82  Pulse: 84   Temp: 98.2 F (36.8 C)  TempSrc: Oral  SpO2: 98%  Weight: 249 lb 1.3 oz (113 kg)  Height: 5\' 11"  (1.803 m)    General: Well developed, well nourished, in no acute distress  Skin : Warm and dry.  Head: Normocephalic and atraumatic  Eyes: Sclera and conjunctiva clear; pupils round and reactive to light; extraocular movements intact  Ears: External normal; canals clear; tympanic membranes erythematous/ congested Oropharynx: Pink, supple. No suspicious lesions  Neck: Supple without thyromegaly, adenopathy  Lungs: Respirations unlabored; clear to auscultation bilaterally without wheeze, rales, rhonchi  CVS exam: normal rate and regular rhythm.  Neurologic: Alert and oriented; speech intact; face symmetrical; moves all extremities well; CNII-XII intact without focal deficit   Assessment:  1. Acute sinusitis, recurrence not specified, unspecified location     Plan:  Rx for Augmentin 875 mg bid x 10 days; Rx for Flonase; increase fluids, rest; follow-up worse, no better.   No Follow-up on file.  No orders of the defined types were placed in this encounter.   Requested Prescriptions   Signed Prescriptions Disp Refills  . amoxicillin-clavulanate (AUGMENTIN) 875-125 MG tablet 20 tablet 0    Sig: Take 1 tablet by mouth 2 (two) times daily.  . fluticasone (FLONASE) 50 MCG/ACT nasal spray 16 g  6    Sig: Place 2 sprays into both nostrils daily.

## 2017-10-02 ENCOUNTER — Other Ambulatory Visit: Payer: Self-pay | Admitting: Family Medicine

## 2017-10-03 ENCOUNTER — Other Ambulatory Visit: Payer: Self-pay | Admitting: Family Medicine

## 2017-10-08 ENCOUNTER — Ambulatory Visit: Payer: Self-pay

## 2017-10-08 NOTE — Telephone Encounter (Signed)
Left message for pt to call back.   Pt needs to schedule an apt to be reevaluated since his symptoms have worsened.  Okay for PEC to advise pt and schedule apt.

## 2017-10-08 NOTE — Telephone Encounter (Signed)
Pt. Seen 09/30/17 and started on Augmentin. Pt. Reports he felt like he started to get better and then got worse. Headache and sinus pressure, severe sinus congestion with green mucus.Denies any fever. Wants to know what Dr. Milinda CaveMcGowen recommends.  Answer Assessment - Initial Assessment Questions 1. LOCATION: "Where does it hurt?"      Headache 2. ONSET: "When did the sinus pain start?"  (e.g., hours, days)      Started last week - had OV last week 3. SEVERITY: "How bad is the pain?"   (Scale 1-10; mild, moderate or severe)   - MILD (1-3): doesn't interfere with normal activities    - MODERATE (4-7): interferes with normal activities (e.g., work or school) or awakens from sleep   - SEVERE (8-10): excruciating pain and patient unable to do any normal activities        6-7 4. RECURRENT SYMPTOM: "Have you ever had sinus problems before?" If so, ask: "When was the last time?" and "What happened that time?"      Yes 5. NASAL CONGESTION: "Is the nose blocked?" If so, ask, "Can you open it or must you breathe through the mouth?"     Congested 6. NASAL DISCHARGE: "Do you have discharge from your nose?" If so ask, "What color?"     Green 7. FEVER: "Do you have a fever?" If so, ask: "What is it, how was it measured, and when did it start?"      No 8. OTHER SYMPTOMS: "Do you have any other symptoms?" (e.g., sore throat, cough, earache, difficulty breathing)     No 9. PREGNANCY: "Is there any chance you are pregnant?" "When was your last menstrual period?"     No  Protocols used: SINUS PAIN OR CONGESTION-A-AH

## 2017-10-09 ENCOUNTER — Encounter: Payer: Self-pay | Admitting: Family Medicine

## 2017-10-09 ENCOUNTER — Ambulatory Visit: Payer: BC Managed Care – PPO | Admitting: Family Medicine

## 2017-10-09 VITALS — BP 123/83 | HR 100 | Temp 98.9°F | Resp 20 | Ht 71.0 in | Wt 247.8 lb

## 2017-10-09 DIAGNOSIS — B9689 Other specified bacterial agents as the cause of diseases classified elsewhere: Secondary | ICD-10-CM | POA: Diagnosis not present

## 2017-10-09 DIAGNOSIS — J329 Chronic sinusitis, unspecified: Secondary | ICD-10-CM | POA: Diagnosis not present

## 2017-10-09 MED ORDER — PREDNISONE 20 MG PO TABS: ORAL_TABLET | ORAL | 0 refills | Status: DC

## 2017-10-09 MED ORDER — LEVOFLOXACIN 500 MG PO TABS: 500.0000 mg | ORAL_TABLET | Freq: Every day | ORAL | 0 refills | Status: DC

## 2017-10-09 MED ORDER — METHYLPREDNISOLONE ACETATE 80 MG/ML IJ SUSP
80.0000 mg | Freq: Once | INTRAMUSCULAR | Status: AC
  Administered 2017-10-09: 80 mg via INTRAMUSCULAR

## 2017-10-09 NOTE — Addendum Note (Signed)
Addended by: Thomasena EdisWILLIAMS, Wael Maestas N on: 10/09/2017 03:08 PM   Modules accepted: Orders

## 2017-10-09 NOTE — Telephone Encounter (Signed)
Pt has apt today (10/09/17) with Dr. Claiborne BillingsKuneff at 2:30pm.

## 2017-10-09 NOTE — Progress Notes (Signed)
Hunter Wiley , 18-Aug-1975, 42 y.o., male MRN: [id number] Patient Care Team    Relationship Specialty Notifications Start End  McGowen, Maryjean Morn, MD PCP - General Family Medicine  04/13/14    Comment: Dr. Alwyn Ren retirement    Chief Complaint  Patient presents with  . URI     Subjective: Pt presents for an OV with complaints of worsening sinus symptoms after treatment with augmentin. He reports he was feeling much better after abx started. However 2 days ago his symptoms returned and has worsened progressively. He has not been using the nasal spray. He has had chills the last 2 days. He has not been able to rest because he can not  breath through his nose.   Depression screen [facility] 2/9 09/30/2017  Decreased Interest 0  Down, Depressed, Hopeless 0  PHQ - 2 Score 0    Allergies  Allergen Reactions  . Lotrel [Amlodipine Besy-Benazepril Hcl] Cough    Cough + sudden periods of severe fatigue   Social History   Tobacco Use  . Smoking status: Never Smoker  . Smokeless tobacco: Never Used  Substance Use Topics  . Alcohol use: No   Past Medical History:  Diagnosis Date  . Diarrhea 03/2015   GI eval (Dr. Dulce Sellar[facility]) ?malabsorptive syndrome?  Work-up pending  . Dislocation, coccyx closed    [facility]  . Family history of colonic polyps    father  . HTN (hypertension)   . Nephrolithiasis    Past Surgical History:  Procedure Laterality Date  . EYE SURGERY     Lasik  . TONSILLECTOMY AND ADENOIDECTOMY  1980  . VASECTOMY  2014  . WISDOM TOOTH EXTRACTION     Family History  Problem Relation Age of Onset  . Hypertension Mother   . Diabetes Mother   . Heart attack Mother        Normal coronaries @ cath  . Hypertension Father   . Lung cancer Maternal Grandfather        mesothelioma  . Heart attack Paternal Grandfather        in 24s   Allergies as of 10/09/2017      Reactions   Lotrel [amlodipine Besy-benazepril Hcl] Cough   Cough + sudden periods of severe fatigue      Medication List        Accurate as of 10/09/17  2:36 PM. Always use your most recent med list.          amoxicillin-clavulanate 875-125 MG tablet Commonly known as:  AUGMENTIN Take 1 tablet by mouth 2 (two) times daily.   fluticasone 50 MCG/ACT nasal spray Commonly known as:  FLONASE Place 2 sprays into both nostrils daily.   hydrochlorothiazide 25 MG tablet Commonly known as:  HYDRODIURIL TAKE 1 TABLET BY MOUTH EVERY DAY   irbesartan 300 MG tablet Commonly known as:  AVAPRO TAKE 1 TABLET BY MOUTH EVERY DAY       All past medical history, surgical history, allergies, family history, immunizations andmedications were updated in the EMR today and reviewed under the history and medication portions of their EMR.     ROS: Negative, with the exception of above mentioned in HPI  Objective:  BP 123/83 (BP Location: Left Arm, Patient Position: Sitting, Cuff Size: Large)   Pulse 100   Temp 98.9 F (37.2 C)   Resp 20   Ht 5\' 11"  (1.803 m)   Wt 247 lb 12 oz (112.4 kg)   SpO2 97%  BMI 34.55 kg/m  Body mass index is 34.55 kg/m. Gen: Afebrile. No acute distress. Nontoxic in appearance, well developed, well nourished.  HENT: AT. [place]. Bilateral TM visualized with fullness, no bulging. MMM, no oral lesions. Bilateral nares with severe erythema, swelling and drainage. Throat without erythema or exudates. Cough and hoarseness present.  Eyes:Pupils Equal Round Reactive to light, Extraocular movements intact,  Conjunctiva without redness, discharge or icterus. Neck/lymp/endocrine: Supple,no lymphadenopathy CV: RRR Chest: CTAB, no wheeze or crackles. Good air movement, normal resp effort.  Neuro:  Normal gait. PERLA. EOMi. Alert. Oriented x3   No exam data present No results found. No results found for this or any previous visit (from the past 24 hour(s)).  Assessment/Plan: Hunter Wiley is a 42 y.o. male present for OV for  Bacterial sinusitis Rest, hydrate.  +/- flonase,  mucinex (DM if cough), nettie pot or nasal saline.  Prednisone (start tomorrow) and levaquin prescribed, take until completed.  DC Augmentin.  IM depo medrol provided If cough present it can last up to 6-8 weeks.  F/U 2 weeks of not improved.    Reviewed expectations re: course of current medical issues.  Discussed self-management of symptoms.  Outlined signs and symptoms indicating need for more acute intervention.  Patient verbalized understanding and all questions were answered.  Patient received an After-Visit Summary.    No orders of the defined types were placed in this encounter.    Note is dictated utilizing voice recognition software. Although note has been proof read prior to signing, occasional typographical errors still can be missed. If any questions arise, please do not hesitate to call for verification.   electronically signed by:  Felix Pacinienee Kuneff, DO  [facility]

## 2017-10-09 NOTE — Patient Instructions (Addendum)
Rest, hydrate.  + flonase, mucinex DM. nettie pot or nasal saline.  levaquin prescribed (statr today), take until completed.  Stop augmentin.  Start prednisone tomorrow. The shot covered today. If cough present it can last up to 6-8 weeks.  F/U 2 weeks of not improved.     Sinusitis, Adult Sinusitis is soreness and inflammation of your sinuses. Sinuses are hollow spaces in the bones around your face. They are located:  Around your eyes.  In the middle of your forehead.  Behind your nose.  In your cheekbones.  Your sinuses and nasal passages are lined with a stringy fluid (mucus). Mucus normally drains out of your sinuses. When your nasal tissues get inflamed or swollen, the mucus can get trapped or blocked so air cannot flow through your sinuses. This lets bacteria, viruses, and funguses grow, and that leads to infection. Follow these instructions at home: Medicines  Take, use, or apply over-the-counter and prescription medicines only as told by your doctor. These may include nasal sprays.  If you were prescribed an antibiotic medicine, take it as told by your doctor. Do not stop taking the antibiotic even if you start to feel better. Hydrate and Humidify  Drink enough water to keep your pee (urine) clear or pale yellow.  Use a cool mist humidifier to keep the humidity level in your home above 50%.  Breathe in steam for 10-15 minutes, 3-4 times a day or as told by your doctor. You can do this in the bathroom while a hot shower is running.  Try not to spend time in cool or dry air. Rest  Rest as much as possible.  Sleep with your head raised (elevated).  Make sure to get enough sleep each night. General instructions  Put a warm, moist washcloth on your face 3-4 times a day or as told by your doctor. This will help with discomfort.  Wash your hands often with soap and water. If there is no soap and water, use hand sanitizer.  Do not smoke. Avoid being around people who  are smoking (secondhand smoke).  Keep all follow-up visits as told by your doctor. This is important. Contact a doctor if:  You have a fever.  Your symptoms get worse.  Your symptoms do not get better within 10 days. Get help right away if:  You have a very bad headache.  You cannot stop throwing up (vomiting).  You have pain or swelling around your face or eyes.  You have trouble seeing.  You feel confused.  Your neck is stiff.  You have trouble breathing. This information is not intended to replace advice given to you by your health care provider. Make sure you discuss any questions you have with your health care provider. Document Released: 01/07/2008 Document Revised: 03/16/2016 Document Reviewed: 05/16/2015 Elsevier Interactive Patient Education  [employer]2018 [employer]

## 2018-03-30 ENCOUNTER — Other Ambulatory Visit: Payer: Self-pay | Admitting: Family Medicine

## 2018-03-30 NOTE — Telephone Encounter (Signed)
Pt over due f/u HTN. Will send Rx for #90 w/ 0RF. Needs office visit for more refills.

## 2018-03-30 NOTE — Telephone Encounter (Signed)
Pt advised and voiced understanding.    Apt made for 04/07/18 at 10:45am

## 2018-04-07 ENCOUNTER — Encounter: Payer: Self-pay | Admitting: Family Medicine

## 2018-04-07 ENCOUNTER — Ambulatory Visit: Payer: BC Managed Care – PPO | Admitting: Family Medicine

## 2018-04-07 VITALS — BP 120/76 | HR 74 | Temp 98.2°F | Resp 16 | Ht 71.0 in | Wt 249.4 lb

## 2018-04-07 DIAGNOSIS — E669 Obesity, unspecified: Secondary | ICD-10-CM

## 2018-04-07 DIAGNOSIS — I10 Essential (primary) hypertension: Secondary | ICD-10-CM | POA: Diagnosis not present

## 2018-04-07 MED ORDER — IRBESARTAN 300 MG PO TABS: 300.0000 mg | ORAL_TABLET | Freq: Every day | ORAL | 1 refills | Status: DC

## 2018-04-07 MED ORDER — HYDROCHLOROTHIAZIDE 25 MG PO TABS: 25.0000 mg | ORAL_TABLET | Freq: Every day | ORAL | 1 refills | Status: DC

## 2018-04-07 NOTE — Progress Notes (Signed)
OFFICE VISIT  04/07/2018   CC:  Chief Complaint  Patient presents with  . Follow-up    HTN, pt is not fasting.     HPI:    Patient is a 42 y.o. Caucasian male who presents for 1 yr f/u HTN. Feeling well.  No HAs, no CP, no SOB, no palpitations, no dizziness. No home bp monitoring.  Gets avg 5-6 hours sleep/night--feels tired from this.  Denies any known snoring or apneic events in sleep---but he'll ask wife. Exercise: walking and playing golf. Low Na diet--doing well.  Past Medical History:  Diagnosis Date  . Diarrhea 03/2015   GI eval (Dr. Dulce Sellar, [facility]) ?malabsorptive syndrome?  Work-up pending  . Dislocation, coccyx closed    [facility]  . Family history of colonic polyps    father  . HTN (hypertension)   . Nephrolithiasis     Past Surgical History:  Procedure Laterality Date  . EYE SURGERY     Lasik  . TONSILLECTOMY AND ADENOIDECTOMY  1980  . VASECTOMY  2014  . WISDOM TOOTH EXTRACTION      Outpatient Medications Prior to Visit  Medication Sig Dispense Refill  . hydrochlorothiazide (HYDRODIURIL) 25 MG tablet TAKE 1 TABLET BY MOUTH EVERY DAY 90 tablet 1  . irbesartan (AVAPRO) 300 MG tablet Take 1 tablet (300 mg total) by mouth daily. OFFICE VISIT NEEDED FOR MORE REFILLS 90 tablet 0  . fluticasone (FLONASE) 50 MCG/ACT nasal spray Place 2 sprays into both nostrils daily. (Patient not taking: Reported on 10/09/2017) 16 g 6  . levofloxacin (LEVAQUIN) 500 MG tablet Take 1 tablet (500 mg total) by mouth daily. (Patient not taking: Reported on 04/07/2018) 7 tablet 0  . predniSONE (DELTASONE) 20 MG tablet 60 mg x3d, 40 mg x3d, 20 mg x2d, 10 mg x2d (Patient not taking: Reported on 04/07/2018) 18 tablet 0   No facility-administered medications prior to visit.     Allergies  Allergen Reactions  . Lotrel [Amlodipine Besy-Benazepril Hcl] Cough    Cough + sudden periods of severe fatigue    ROS As per HPI  PE: Blood pressure 120/76, pulse 74, temperature 98.2 F (36.8  C), temperature source Oral, resp. rate 16, height 5\' 11"  (1.803 m), weight 249 lb 6 oz (113.1 kg), SpO2 95 %. Body mass index is 34.78 kg/m.  Gen: Alert, well appearing.  Patient is oriented to person, place, time, and situation. AFFECT: pleasant, lucid thought and speech. CV: RRR, no m/r/g.   LUNGS: CTA bilat, nonlabored resps, good aeration in all lung fields. EXT: no clubbing or cyanosis.  no edema.    LABS:  Lab Results  Component Value Date   TSH 2.45 03/15/2015   Lab Results  Component Value Date   WBC 8.7 03/15/2015   HGB 15.1 03/15/2015   HCT 44 03/15/2015   MCV 87.8 05/04/2012   PLT 164 03/15/2015   Lab Results  Component Value Date   CREATININE 1.22 02/10/2017   BUN 12 02/10/2017   NA 139 02/10/2017   K 3.6 02/10/2017   CL 102 02/10/2017   CO2 28 02/10/2017   Lab Results  Component Value Date   ALT 54 (H) 09/07/2015   AST 31 09/07/2015   ALKPHOS 48 09/07/2015   BILITOT 1.0 09/07/2015   Lab Results  Component Value Date   CHOL 205 (H) 09/07/2015   Lab Results  Component Value Date   HDL 37.10 (L) 09/07/2015   Lab Results  Component Value Date   LDLCALC  136 (H) 09/07/2015   Lab Results  Component Value Date   TRIG 157.0 (H) 09/07/2015   Lab Results  Component Value Date   CHOLHDL 6 09/07/2015    IMPRESSION AND PLAN:  HTN: doing well. Needs to check bp occasionally at home. He'll return when fasting to get CMET and FLP. RF'd meds today.  An After Visit Summary was printed and given to the patient.  FOLLOW UP: Return in about 6 months (around 10/06/2018) for annual CPE (fasting).  Signed:  Santiago Bumpers, MD           04/07/2018

## 2018-04-14 ENCOUNTER — Other Ambulatory Visit (INDEPENDENT_AMBULATORY_CARE_PROVIDER_SITE_OTHER): Payer: BC Managed Care – PPO

## 2018-04-14 DIAGNOSIS — E669 Obesity, unspecified: Secondary | ICD-10-CM | POA: Diagnosis not present

## 2018-04-14 DIAGNOSIS — I10 Essential (primary) hypertension: Secondary | ICD-10-CM

## 2018-04-14 LAB — COMPREHENSIVE METABOLIC PANEL
ALK PHOS: 44 U/L (ref 39–117)
ALT: 74 U/L — ABNORMAL HIGH (ref 0–53)
AST: 36 U/L (ref 0–37)
Albumin: 4.5 g/dL (ref 3.5–5.2)
BUN: 16 mg/dL (ref 6–23)
CO2: 33 mEq/L — ABNORMAL HIGH (ref 19–32)
Calcium: 10.2 mg/dL (ref 8.4–10.5)
Chloride: 99 mEq/L (ref 96–112)
Creatinine, Ser: 1.17 mg/dL (ref 0.40–1.50)
GFR: 71.94 mL/min (ref >60.00)
Glucose, Bld: 101 mg/dL — ABNORMAL HIGH (ref 70–99)
Potassium: 4 mEq/L (ref 3.5–5.1)
Sodium: 139 mEq/L (ref 135–145)
Total Bilirubin: 0.8 mg/dL (ref 0.2–1.2)
Total Protein: 7.3 g/dL (ref 6.0–8.3)

## 2018-04-14 LAB — LIPID PANEL
CHOLESTEROL: 174 mg/dL (ref 0–200)
HDL: 38.6 mg/dL — ABNORMAL LOW (ref >39.00)
LDL CALC: 99 mg/dL (ref 0–99)
NonHDL: 135.69
TRIGLYCERIDES: 183 mg/dL — AB (ref 0.0–149.0)
Total CHOL/HDL Ratio: 5
VLDL: 36.6 mg/dL (ref 0.0–40.0)

## 2018-04-15 ENCOUNTER — Other Ambulatory Visit: Payer: Self-pay | Admitting: Family Medicine

## 2018-04-15 ENCOUNTER — Other Ambulatory Visit: Payer: BC Managed Care – PPO

## 2018-04-15 DIAGNOSIS — R740 Nonspecific elevation of levels of transaminase and lactic acid dehydrogenase [LDH]: Principal | ICD-10-CM

## 2018-04-15 DIAGNOSIS — R7401 Elevation of levels of liver transaminase levels: Secondary | ICD-10-CM

## 2018-04-16 LAB — HEPATITIS B SURFACE ANTIGEN: Hepatitis B Surface Ag: NONREACTIVE

## 2018-04-16 LAB — HEPATITIS C ANTIBODY
Hepatitis C Ab: NONREACTIVE
SIGNAL TO CUT-OFF: 0.01 (ref <1.00)

## 2018-04-17 ENCOUNTER — Encounter: Payer: Self-pay | Admitting: Family Medicine

## 2018-09-01 ENCOUNTER — Other Ambulatory Visit: Payer: Self-pay | Admitting: Family Medicine

## 2018-09-04 ENCOUNTER — Emergency Department (HOSPITAL_COMMUNITY)
Admission: EM | Admit: 2018-09-04 | Discharge: 2018-09-05 | Disposition: A | Discharge status: Home / Self Care | Payer: BC Managed Care – PPO | Attending: Emergency Medicine | Admitting: Emergency Medicine

## 2018-09-04 ENCOUNTER — Other Ambulatory Visit: Payer: Self-pay

## 2018-09-04 ENCOUNTER — Emergency Department (HOSPITAL_COMMUNITY): Payer: BC Managed Care – PPO

## 2018-09-04 ENCOUNTER — Encounter (HOSPITAL_COMMUNITY): Payer: Self-pay

## 2018-09-04 DIAGNOSIS — I10 Essential (primary) hypertension: Secondary | ICD-10-CM | POA: Diagnosis not present

## 2018-09-04 DIAGNOSIS — R079 Chest pain, unspecified: Secondary | ICD-10-CM | POA: Diagnosis present

## 2018-09-04 LAB — BASIC METABOLIC PANEL
Anion gap: 15 (ref 5–15)
BUN: 15 mg/dL (ref 6–20)
CO2: 25 mmol/L (ref 22–32)
Calcium: 9.4 mg/dL (ref 8.9–10.3)
Chloride: 97 mmol/L — ABNORMAL LOW (ref 98–111)
Creatinine, Ser: 1.19 mg/dL (ref 0.61–1.24)
GFR calc Af Amer: >60 mL/min (ref >60)
Glucose, Bld: 123 mg/dL — ABNORMAL HIGH (ref 70–99)
Potassium: 3.3 mmol/L — ABNORMAL LOW (ref 3.5–5.1)
Sodium: 137 mmol/L (ref 135–145)

## 2018-09-04 LAB — CBC
HCT: 46.7 % (ref 39.0–52.0)
Hemoglobin: 15.9 g/dL (ref 13.0–17.0)
MCH: 28.6 pg (ref 26.0–34.0)
MCHC: 34 g/dL (ref 30.0–36.0)
MCV: 84.1 fL (ref 80.0–100.0)
PLATELETS: 208 10*3/uL (ref 150–400)
RBC: 5.55 MIL/uL (ref 4.22–5.81)
RDW: 12.3 % (ref 11.5–15.5)
WBC: 8.8 10*3/uL (ref 4.0–10.5)
nRBC: 0 % (ref 0.0–0.2)

## 2018-09-04 LAB — TROPONIN I

## 2018-09-04 MED ORDER — ASPIRIN 81 MG PO CHEW
243.0000 mg | CHEWABLE_TABLET | Freq: Once | ORAL | Status: AC
  Administered 2018-09-04: 243 mg via ORAL
  Filled 2018-09-04: qty 3

## 2018-09-04 MED ORDER — OXYCODONE HCL 5 MG PO TABS
5.0000 mg | ORAL_TABLET | Freq: Once | ORAL | Status: DC
  Filled 2018-09-04: qty 1

## 2018-09-04 NOTE — ED Provider Notes (Signed)
[facility] Emergency Department Provider Note MRN:  [id number]  Arrival date & time: 09/04/18     Chief Complaint   Chest Pain   History of Present Illness   Hunter Wiley is a 43 y.o. year-old male with a history of hypertension presenting to the ED with chief complaint of chest pain.  Pain began 5 hours prior to arrival, sudden onset, occurred when patient was sitting and at rest prior to dinner.  Pain located in the left upper chest, with radiation to the left arm with paresthesias to the left arm.  Denies any associated symptoms, no dizziness, no diaphoresis, no nausea, no vomiting, no shortness of breath.  This is his first time having chest pain.  No exacerbating or relieving factors.  Review of Systems  A complete 10 system review of systems was obtained and all systems are negative except as noted in the HPI and PMH.   Patient's Health History    Past Medical History:  Diagnosis Date  . Diarrhea 03/2015   GI eval (Dr. Dulce Sellar, [facility]) ?malabsorptive syndrome?  Work-up pending  . Dislocation, coccyx closed    [facility]  . Elevated transaminase level 2017, 2019   Very mild ALT elev: Hep serologies neg.  . Family history of colonic polyps    father  . HTN (hypertension)   . Nephrolithiasis     Past Surgical History:  Procedure Laterality Date  . EYE SURGERY     Lasik  . TONSILLECTOMY AND ADENOIDECTOMY  1980  . VASECTOMY  2014  . WISDOM TOOTH EXTRACTION      Family History  Problem Relation Age of Onset  . Hypertension Mother   . Diabetes Mother   . Heart attack Mother        Normal coronaries @ cath  . Hypertension Father   . Lung cancer Maternal Grandfather        mesothelioma  . Heart attack Paternal Grandfather        in 32s    Social History   Socioeconomic History  . Marital status: Married    Spouse name: Not on file  . Number of children: Not on file  . Years of education: Not on file  . Highest education level: Not on file    Occupational History  . Not on file  Social Needs  . Financial resource strain: Not on file  . Food insecurity:    Worry: Not on file    Inability: Not on file  . Transportation needs:    Medical: Not on file    Non-medical: Not on file  Tobacco Use  . Smoking status: Never Smoker  . Smokeless tobacco: Never Used  Substance and Sexual Activity  . Alcohol use: No  . Drug use: No  . Sexual activity: Not on file  Lifestyle  . Physical activity:    Days per week: Not on file    Minutes per session: Not on file  . Stress: Not on file  Relationships  . Social connections:    Talks on phone: Not on file    Gets together: Not on file    Attends religious service: Not on file    Active member of club or organization: Not on file    Attends meetings of clubs or organizations: Not on file    Relationship status: Not on file  . Intimate partner violence:    Fear of current or ex partner: Not on file    Emotionally  abused: Not on file    Physically abused: Not on file    Forced sexual activity: Not on file  Other Topics Concern  . Not on file  Social History Narrative   Married, 2 sons.   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   Lives in [place].   No T/A/Ds.   No FH of prostate or colon cancer     Physical Exam  Vital Signs and Nursing Notes reviewed Vitals:   09/04/18 2157  BP: (!) 135/96  Pulse: 80  Resp: 19  Temp: 97.8 F (36.6 C)  SpO2: 99%    CONSTITUTIONAL: Well-appearing, NAD NEURO:  Alert and oriented x 3, no focal deficits EYES:  eyes equal and reactive ENT/NECK:  no LAD, no JVD CARDIO: Regular rate, well-perfused, normal S1 and S2 PULM:  CTAB no wheezing or rhonchi GI/GU:  normal bowel sounds, non-distended, non-tender MSK/SPINE:  No gross deformities, no edema SKIN:  no rash, atraumatic PSYCH:  Appropriate speech and behavior  Diagnostic and Interventional Summary    EKG Interpretation  Date/Time:  Saturday September 04 2018 21:56:25  EST Ventricular Rate:  86 PR Interval:    QRS Duration: 88 QT Interval:  351 QTC Calculation: 420 R Axis:   51 Text Interpretation:  Sinus rhythm Confirmed by Kennis Carina [phone]) on 09/04/2018 10:05:17 PM      Labs Reviewed  CBC  BASIC METABOLIC PANEL  TROPONIN I    DG Chest 2 View    (Results Pending)    Medications  aspirin chewable tablet 243 mg (has no administration in time range)  oxyCODONE (Oxy IR/ROXICODONE) immediate release tablet 5 mg (has no administration in time range)     Procedures Critical Care  ED Course and Medical Decision Making  I have reviewed the triage vital signs and the nursing notes.  Pertinent labs & imaging results that were available during my care of the patient were reviewed by me and considered in my medical decision making (see below for details).  Considering ACS, little to no concern for PE based on lack of risk factors, no tachycardia, no evidence of DVT on exam.  Troponin pending EKG reassuring.  First troponin negative, second troponin pending.  Patient was shoveling dirt earlier today, favoring MSK.  Also with low heart score, appropriate for discharge if second troponin negative.  Signed out to Dr. Randel Pigg at shift change.  Elmer Sow. Pilar Plate, MD [facility] Emergency Medicine [facility] [email or web address]  Final Clinical Impressions(s) / ED Diagnoses     ICD-10-CM   1. Chest pain R07.9 DG Chest 2 View    DG Chest 2 View    ED Discharge Orders    None         Sabas Sous, MD 09/05/18 [phone]

## 2018-09-04 NOTE — ED Triage Notes (Signed)
Pt here with upper left sided chest pain that goes down the left arm for the last hour.  Pt states pain was intense at first now it is less.  Patient endorses tingling to the left arm.  A&Ox4.

## 2018-09-04 NOTE — ED Triage Notes (Signed)
Pt arrives POV for eval of chest pain and arm heaviness onset 1630. Pt denies SOB/diaphoresis/N/V. Describes pain as 5/10 and throbbing

## 2018-09-05 LAB — TROPONIN I: Troponin I: <0.03 ng/mL (ref <0.03)

## 2018-09-05 NOTE — ED Provider Notes (Signed)
Patient signed out to me by Dr. Pilar Plate to follow-up on second troponin.  Patient felt to be low risk, presented with chest pain earlier.  His work-up has been negative.  Second troponin is now also normal.  Patient appropriate for discharge and outpatient work-up.   Gilda Crease, MD 09/05/18 [phone]

## 2018-10-07 ENCOUNTER — Encounter: Payer: Self-pay | Admitting: Family Medicine

## 2018-10-07 ENCOUNTER — Ambulatory Visit (INDEPENDENT_AMBULATORY_CARE_PROVIDER_SITE_OTHER): Payer: BC Managed Care – PPO | Admitting: Family Medicine

## 2018-10-07 VITALS — BP 115/72 | HR 86 | Temp 98.1°F | Resp 16 | Ht 71.0 in | Wt 257.6 lb

## 2018-10-07 DIAGNOSIS — Z0000 Encounter for general adult medical examination without abnormal findings: Secondary | ICD-10-CM | POA: Diagnosis not present

## 2018-10-07 DIAGNOSIS — I10 Essential (primary) hypertension: Secondary | ICD-10-CM

## 2018-10-07 DIAGNOSIS — R740 Nonspecific elevation of levels of transaminase and lactic acid dehydrogenase [LDH]: Secondary | ICD-10-CM | POA: Diagnosis not present

## 2018-10-07 DIAGNOSIS — R7401 Elevation of levels of liver transaminase levels: Secondary | ICD-10-CM

## 2018-10-07 MED ORDER — IRBESARTAN 300 MG PO TABS: 300.0000 mg | ORAL_TABLET | Freq: Every day | ORAL | 1 refills | Status: DC

## 2018-10-07 MED ORDER — HYDROCHLOROTHIAZIDE 25 MG PO TABS: 25.0000 mg | ORAL_TABLET | Freq: Every day | ORAL | 1 refills | Status: DC

## 2018-10-07 NOTE — Patient Instructions (Signed)

## 2018-10-07 NOTE — Progress Notes (Signed)
Office Note 10/07/2018  CC:  Chief Complaint  Patient presents with  . Annual Exam    pt is not fasting    HPI:  Hunter Wiley is a 43 y.o. White male who is here for annual health maintenance exam.  Recent ED visit 09/04/2018 for chest pain (L sided chest and left arm NOT exertional--no other Ruled out for ACS.  No new meds, no referrals made.  CBC and CMET normal except slightly low K and mild elevation of ALT (not new). Question musculoskeletal vs GI etiology.  No recurrence of any pain since then.  He feels good.  He is active but no formal exercise. Diet: not working on anything at this time.  Past Medical History:  Diagnosis Date  . Diarrhea 03/2015   GI eval (Dr. Dulce Sellar, [facility]) ?malabsorptive syndrome?  Work-up pending  . Dislocation, coccyx closed    [facility]  . Elevated transaminase level 2017, 2019   Very mild ALT elev: Hep serologies neg.  . Family history of colonic polyps    father  . HTN (hypertension)   . Nephrolithiasis     Past Surgical History:  Procedure Laterality Date  . EYE SURGERY     Lasik  . TONSILLECTOMY AND ADENOIDECTOMY  1980  . VASECTOMY  2014  . WISDOM TOOTH EXTRACTION      Family History  Problem Relation Age of Onset  . Hypertension Mother   . Diabetes Mother   . Heart attack Mother        Normal coronaries @ cath  . Hypertension Father   . Lung cancer Maternal Grandfather        mesothelioma  . Heart attack Paternal Grandfather        in 2s    Social History   Socioeconomic History  . Marital status: Married    Spouse name: Not on file  . Number of children: Not on file  . Years of education: Not on file  . Highest education level: Not on file  Occupational History  . Not on file  Social Needs  . Financial resource strain: Not on file  . Food insecurity:    Worry: Not on file    Inability: Not on file  . Transportation needs:    Medical: Not on file    Non-medical: Not on file  Tobacco Use  . Smoking  status: Never Smoker  . Smokeless tobacco: Never Used  Substance and Sexual Activity  . Alcohol use: No  . Drug use: No  . Sexual activity: Not on file  Lifestyle  . Physical activity:    Days per week: Not on file    Minutes per session: Not on file  . Stress: Not on file  Relationships  . Social connections:    Talks on phone: Not on file    Gets together: Not on file    Attends religious service: Not on file    Active member of club or organization: Not on file    Attends meetings of clubs or organizations: Not on file    Relationship status: Not on file  . Intimate partner violence:    Fear of current or ex partner: Not on file    Emotionally abused: Not on file    Physically abused: Not on file    Forced sexual activity: Not on file  Other Topics Concern  . Not on file  Social History Narrative   Married, 2 sons.   Occupation: owner of food  service company.   Lives in [place].   No T/A/Ds.   No FH of prostate or colon cancer    Outpatient Medications Prior to Visit  Medication Sig Dispense Refill  . aspirin EC 81 MG tablet Take 81 mg by mouth daily.    Marland Kitchen UNKNOWN TO PATIENT Take 1 tablet by mouth See admin instructions. Unnamed allergy tablet ([employer] brand): Take 1 tablet by mouth once a day as needed for allergies    . hydrochlorothiazide (HYDRODIURIL) 25 MG tablet Take 1 tablet (25 mg total) by mouth daily. 90 tablet 1  . irbesartan (AVAPRO) 300 MG tablet Take 1 tablet (300 mg total) by mouth daily. 90 tablet 1   No facility-administered medications prior to visit.     Allergies  Allergen Reactions  . Lotrel [Amlodipine Besy-Benazepril Hcl] Other (See Comments) and Cough    Cough + sudden periods of severe fatigue    ROS Review of Systems  Constitutional: Negative for appetite change, chills, fatigue and fever.  HENT: Negative for congestion, dental problem, ear pain and sore throat.   Eyes: Negative for discharge, redness and visual disturbance.   Respiratory: Negative for cough, chest tightness, shortness of breath and wheezing.   Cardiovascular: Negative for chest pain, palpitations and leg swelling.  Gastrointestinal: Negative for abdominal pain, blood in stool, diarrhea, nausea and vomiting.  Genitourinary: Negative for difficulty urinating, dysuria, flank pain, frequency, hematuria and urgency.  Musculoskeletal: Negative for arthralgias, back pain, joint swelling, myalgias and neck stiffness.  Skin: Negative for pallor and rash.  Neurological: Negative for dizziness, speech difficulty, weakness and headaches.  Hematological: Negative for adenopathy. Does not bruise/bleed easily.  Psychiatric/Behavioral: Negative for confusion and sleep disturbance. The patient is not nervous/anxious.     PE; Blood pressure 115/72, pulse 86, temperature 98.1 F (36.7 C), temperature source Oral, resp. rate 16, height  (1.803 m), weight 257 lb 9.6 oz (116.8 kg), SpO2 94 %. Body mass index is 35.93 kg/m.  Gen: Alert, well appearing.  Patient is oriented to person, place, time, and situation. AFFECT: pleasant, lucid thought and speech. ENT: Ears: EACs clear, normal epithelium.  TMs with good light reflex and landmarks bilaterally.  Eyes: no injection, icteris, swelling, or exudate.  EOMI, PERRLA. Nose: no drainage or turbinate edema/swelling.  No injection or focal lesion.  Mouth: lips without lesion/swelling.  Oral mucosa pink and moist.  Dentition intact and without obvious caries or gingival swelling.  Oropharynx without erythema, exudate, or swelling.  Neck: supple/nontender.  No LAD, mass, or TM.  Carotid pulses 2+ bilaterally, without bruits. CV: RRR, no m/r/g.   LUNGS: CTA bilat, nonlabored resps, good aeration in all lung fields. ABD: soft, NT, ND, BS normal.  No hepatospenomegaly or mass.  No bruits. EXT: no clubbing, cyanosis, or edema.  Musculoskeletal: no joint swelling, erythema, warmth, or tenderness.  ROM of all joints  intact. Skin - no sores or suspicious lesions or rashes or color changes   Pertinent labs:  Lab Results  Component Value Date   TSH 2.45 03/15/2015   Lab Results  Component Value Date   WBC 8.8 09/04/2018   HGB 15.9 09/04/2018   HCT 46.7 09/04/2018   MCV 84.1 09/04/2018   PLT 208 09/04/2018   Lab Results  Component Value Date   CREATININE 1.19 09/04/2018   BUN 15 09/04/2018   NA 137 09/04/2018   K 3.3 (L) 09/04/2018   CL 97 (L) 09/04/2018   CO2 25 09/04/2018   Lab Results  Component Value Date   ALT 74 (H) 04/14/2018   AST 36 04/14/2018   ALKPHOS 44 04/14/2018   BILITOT 0.8 04/14/2018   Lab Results  Component Value Date   CHOL 174 04/14/2018   Lab Results  Component Value Date   HDL 38.60 (L) 04/14/2018   Lab Results  Component Value Date   LDLCALC 99 04/14/2018   Lab Results  Component Value Date   TRIG 183.0 (H) 04/14/2018   Lab Results  Component Value Date   CHOLHDL 5 04/14/2018    ASSESSMENT AND PLAN:   Health maintenance exam: Reviewed age and gender appropriate health maintenance issues (prudent diet, regular exercise, health risks of tobacco and excessive alcohol, use of seatbelts, fire alarms in home, use of sunscreen).  Also reviewed age and gender appropriate health screening as well as vaccine recommendations. Vaccines: flu vaccine-->he declines.  Tdap UTD. Labs:CBC and CMET fine 1 mo ago.  Return for fasting lipids and glucose. Prostate ca screening: average risk patient= as per latest guidelines, start screening at 11 yrs of age. Colon ca screening: average risk patient= as per latest guidelines, start screening at 48 yrs of age.  An After Visit Summary was printed and given to the patient.  FOLLOW UP:  Return in about 6 months (around 04/09/2019) for routine chronic illness f/u.  Signed:  Santiago Bumpers, MD           10/07/2018

## 2018-10-12 ENCOUNTER — Other Ambulatory Visit: Payer: BC Managed Care – PPO

## 2019-03-26 ENCOUNTER — Other Ambulatory Visit: Payer: Self-pay | Admitting: Family Medicine

## 2019-04-14 ENCOUNTER — Ambulatory Visit: Payer: BC Managed Care – PPO | Admitting: Family Medicine

## 2019-05-02 ENCOUNTER — Other Ambulatory Visit: Payer: Self-pay

## 2019-05-02 ENCOUNTER — Ambulatory Visit (INDEPENDENT_AMBULATORY_CARE_PROVIDER_SITE_OTHER): Payer: BC Managed Care – PPO | Admitting: Family Medicine

## 2019-05-02 ENCOUNTER — Encounter: Payer: Self-pay | Admitting: Family Medicine

## 2019-05-02 VITALS — BP 124/82 | HR 83 | Temp 98.4°F | Resp 16 | Ht 71.0 in | Wt 244.5 lb

## 2019-05-02 DIAGNOSIS — Z23 Encounter for immunization: Secondary | ICD-10-CM

## 2019-05-02 DIAGNOSIS — E669 Obesity, unspecified: Secondary | ICD-10-CM

## 2019-05-02 DIAGNOSIS — I10 Essential (primary) hypertension: Secondary | ICD-10-CM

## 2019-05-02 NOTE — Progress Notes (Signed)
OFFICE VISIT  05/02/2019   CC:  Chief Complaint  Patient presents with  . Follow-up    RCI. wants flu shot today. took medication today   HPI:    Patient is a 43 y.o. Caucasian male who presents for 6 mo f/u HTN. Feeling well. No home bp monitoring. Changed diet a lot regarding carbs, has lost 12 lbs in last 6 mo.  SMaller portion sizes. He remains active with his work but no formal exercise. Takes care of TWO HUGE GREAT DANES (140 lbs and 180 lbs!).   He does take an ASA qd.  In review of chart today shows no hx of CAD, CVA, TIA, PAD, or radiologically visualized atherosclerosis.  ROS: no CP, no SOB, no wheezing, no cough, no dizziness, no HAs, no rashes, no melena/hematochezia.  No polyuria or polydipsia.  No myalgias or arthralgias.  Past Medical History:  Diagnosis Date  . Diarrhea 03/2015   GI eval (Dr. Paulita Fujita, [facility]) ?malabsorptive syndrome?  Work-up pending  . Dislocation, coccyx closed    [facility]  . Elevated transaminase level 2017, 2019   Very mild ALT elev: Hep serologies neg.  . Family history of colonic polyps    father  . HTN (hypertension)   . Nephrolithiasis   . Nonexertional chest pain    Ruled out in ED 09/2018.  Isolated occurence.    Past Surgical History:  Procedure Laterality Date  . EYE SURGERY     Lasik  . TONSILLECTOMY AND ADENOIDECTOMY  1980  . VASECTOMY  2014  . WISDOM TOOTH EXTRACTION      Outpatient Medications Prior to Visit  Medication Sig Dispense Refill  . aspirin EC 81 MG tablet Take 81 mg by mouth daily.    . hydrochlorothiazide (HYDRODIURIL) 25 MG tablet TAKE 1 TABLET BY MOUTH EVERY DAY 90 tablet 1  . irbesartan (AVAPRO) 300 MG tablet Take 1 tablet (300 mg total) by mouth daily. 90 tablet 1  . phentermine 37.5 MG capsule Take by mouth. Patient takes half a tablet    . UNKNOWN TO PATIENT Take 1 tablet by mouth See admin instructions. Unnamed allergy tablet ([employer] brand): Take 1 tablet by mouth once a day as needed  for allergies     No facility-administered medications prior to visit.     Allergies  Allergen Reactions  . Lotrel [Amlodipine Besy-Benazepril Hcl] Other (See Comments) and Cough    Cough + sudden periods of severe fatigue    ROS As per HPI  PE: Blood pressure 124/82, pulse 83, temperature 98.4 F (36.9 C), temperature source Temporal, resp. rate 16, height 5\' 11"  (1.803 m), weight 244 lb 8 oz (110.9 kg), SpO2 96 %. Gen: Alert, well appearing.  Patient is oriented to person, place, time, and situation. AFFECT: pleasant, lucid thought and speech. CV: RRR, no m/r/g.   LUNGS: CTA bilat, nonlabored resps, good aeration in all lung fields. EXT: no clubbing or cyanosis.  no edema.    LABS:  Lab Results  Component Value Date   TSH 2.45 03/15/2015   Lab Results  Component Value Date   WBC 8.8 09/04/2018   HGB 15.9 09/04/2018   HCT 46.7 09/04/2018   MCV 84.1 09/04/2018   PLT 208 09/04/2018   Lab Results  Component Value Date   CREATININE 1.19 09/04/2018   BUN 15 09/04/2018   NA 137 09/04/2018   K 3.3 (L) 09/04/2018   CL 97 (L) 09/04/2018   CO2 25 09/04/2018   Lab Results  Component Value Date   ALT 74 (H) 04/14/2018   AST 36 04/14/2018   ALKPHOS 44 04/14/2018   BILITOT 0.8 04/14/2018   Lab Results  Component Value Date   CHOL 174 04/14/2018   Lab Results  Component Value Date   HDL 38.60 (L) 04/14/2018   Lab Results  Component Value Date   LDLCALC 99 04/14/2018   Lab Results  Component Value Date   TRIG 183.0 (H) 04/14/2018   Lab Results  Component Value Date   CHOLHDL 5 04/14/2018    IMPRESSION AND PLAN:  HTN: The current medical regimen is effective;  continue present plan and medications. No labs or RF's needed today. We've discussed aspirin and it's newest studies that fail to show significant benefit for primary CV prevention.  At this time he has chosen to continue taking his ASA 81mg  qd.  An After Visit Summary was printed and given to the  patient.  FOLLOW UP: Return in about 6 months (around 10/30/2019) for annual CPE (fasting).  Signed:  11/01/2019, MD           05/02/2019  Spent 15 min with pt today, with >50% of this time spent in counseling and care coordination regarding the above problems.

## 2019-05-26 ENCOUNTER — Other Ambulatory Visit: Payer: Self-pay | Admitting: Family Medicine

## 2019-07-13 ENCOUNTER — Other Ambulatory Visit: Payer: Self-pay

## 2019-07-13 ENCOUNTER — Encounter: Payer: Self-pay | Admitting: Family Medicine

## 2019-07-13 ENCOUNTER — Ambulatory Visit: Payer: BC Managed Care – PPO | Admitting: Family Medicine

## 2019-07-13 VITALS — BP 123/85 | HR 86 | Temp 98.6°F | Resp 16 | Ht 71.0 in | Wt 238.6 lb

## 2019-07-13 DIAGNOSIS — M109 Gout, unspecified: Secondary | ICD-10-CM | POA: Diagnosis not present

## 2019-07-13 DIAGNOSIS — M79674 Pain in right toe(s): Secondary | ICD-10-CM | POA: Diagnosis not present

## 2019-07-13 MED ORDER — ALLOPURINOL 100 MG PO TABS: ORAL_TABLET | ORAL | 1 refills | Status: DC

## 2019-07-13 MED ORDER — PREDNISONE 20 MG PO TABS: ORAL_TABLET | ORAL | 0 refills | Status: DC

## 2019-07-13 NOTE — Patient Instructions (Signed)

## 2019-07-13 NOTE — Progress Notes (Signed)
OFFICE VISIT  07/13/2019   CC:  Chief Complaint  Patient presents with  . Foot Pain    right, 1 week    HPI:    Patient is a 43 y.o. Caucasian male who presents for right foot pain. Onset about 1 week ago, pain in MTP of great toe on R, w/out preceding injury. No known high purine/culprit foods lately.  No swelling, redness, or heat, and not so tender to touch but hurts a lot to walk on it.  Not consistently improving, but today is a better day. He took aleve 2 tabs on two occasions over the last 5 d, no help.  REcalls an episode of this last year, lasted about 1 wk and spont resolved. Then this summer he ate shrimp a couple days in a row then had onset pain in R MTP jt great toe.  No other joints have bothered him. Past Medical History:  Diagnosis Date  . Diarrhea 03/2015   GI eval (Dr. Paulita Fujita, [facility]) ?malabsorptive syndrome?  Work-up pending  . Dislocation, coccyx closed    [facility]  . Elevated transaminase level 2017, 2019   Very mild ALT elev: Hep serologies neg.  . Family history of colonic polyps    father  . HTN (hypertension)   . Nephrolithiasis   . Nonexertional chest pain    Ruled out in ED 09/2018.  Isolated occurence.    Past Surgical History:  Procedure Laterality Date  . EYE SURGERY     Lasik  . TONSILLECTOMY AND ADENOIDECTOMY  1980  . VASECTOMY  2014  . WISDOM TOOTH EXTRACTION      Outpatient Medications Prior to Visit  Medication Sig Dispense Refill  . aspirin EC 81 MG tablet Take 81 mg by mouth daily.    . hydrochlorothiazide (HYDRODIURIL) 25 MG tablet TAKE 1 TABLET BY MOUTH EVERY DAY 90 tablet 1  . irbesartan (AVAPRO) 300 MG tablet TAKE 1 TABLET BY MOUTH EVERY DAY 90 tablet 1  . phentermine 37.5 MG capsule Take by mouth. Patient takes half a tablet    . UNKNOWN TO PATIENT Take 1 tablet by mouth See admin instructions. Unnamed allergy tablet ([employer] brand): Take 1 tablet by mouth once a day as needed for allergies     No  facility-administered medications prior to visit.     Allergies  Allergen Reactions  . Lotrel [Amlodipine Besy-Benazepril Hcl] Other (See Comments) and Cough    Cough + sudden periods of severe fatigue    ROS As per HPI  PE: Blood pressure 123/85, pulse 86, temperature 98.6 F (37 C), temperature source Temporal, resp. rate 16, height 5\' 11"  (1.803 m), weight 238 lb 9.6 oz (108.2 kg), SpO2 93 %. Body mass index is 33.28 kg/m.  Gen: Alert, well appearing.  Patient is oriented to person, place, time, and situation. AFFECT: pleasant, lucid thought and speech. Right great toe MTP joint with slight swelling and warmth, minimal TTP and no erythema.   LABS:    Chemistry      Component Value Date/Time   NA 137 09/04/2018 2215   NA 139 03/15/2015   K 3.3 (L) 09/04/2018 2215   CL 97 (L) 09/04/2018 2215   CO2 25 09/04/2018 2215   BUN 15 09/04/2018 2215   BUN 15 03/15/2015   CREATININE 1.19 09/04/2018 2215   GLU 87 03/15/2015      Component Value Date/Time   CALCIUM 9.4 09/04/2018 2215   ALKPHOS 44 04/14/2018 0810   AST 36  04/14/2018 0810   ALT 74 (H) 04/14/2018 0810   BILITOT 0.8 04/14/2018 0810     Lab Results  Component Value Date   WBC 8.8 09/04/2018   HGB 15.9 09/04/2018   HCT 46.7 09/04/2018   MCV 84.1 09/04/2018   PLT 208 09/04/2018    IMPRESSION AND PLAN:  Podagra, 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 Start prednisone 40mg  qd x 5d. Also start allopurinol 100mg  qd x 7-10d, then increase to 200 mg qd.  An After Visit Summary was printed and given to the patient.  FOLLOW UP: Return in about 6 weeks (around 08/24/2019) for f/u gout.  Signed:  , MD           07/13/2019

## 2019-07-14 LAB — URIC ACID: Uric Acid, Serum: 7 mg/dL (ref 4.0–7.8)

## 2019-08-04 ENCOUNTER — Other Ambulatory Visit: Payer: Self-pay | Admitting: Family Medicine

## 2019-09-06 ENCOUNTER — Other Ambulatory Visit: Payer: Self-pay | Admitting: Family Medicine

## 2019-09-16 ENCOUNTER — Other Ambulatory Visit: Payer: Self-pay | Admitting: Family Medicine

## 2019-10-03 ENCOUNTER — Other Ambulatory Visit: Payer: Self-pay | Admitting: Family Medicine

## 2019-10-10 ENCOUNTER — Other Ambulatory Visit: Payer: Self-pay | Admitting: Family Medicine

## 2019-11-02 ENCOUNTER — Other Ambulatory Visit: Payer: Self-pay | Admitting: Family Medicine

## 2019-11-17 ENCOUNTER — Other Ambulatory Visit: Payer: Self-pay | Admitting: Family Medicine

## 2019-11-24 ENCOUNTER — Other Ambulatory Visit: Payer: Self-pay

## 2019-11-24 ENCOUNTER — Ambulatory Visit: Payer: BC Managed Care – PPO | Admitting: Family Medicine

## 2019-11-24 ENCOUNTER — Encounter: Payer: Self-pay | Admitting: Family Medicine

## 2019-11-24 VITALS — BP 114/83 | HR 83 | Temp 98.6°F | Resp 16 | Ht 71.0 in | Wt 244.4 lb

## 2019-11-24 DIAGNOSIS — M109 Gout, unspecified: Secondary | ICD-10-CM

## 2019-11-24 DIAGNOSIS — I10 Essential (primary) hypertension: Secondary | ICD-10-CM

## 2019-11-24 DIAGNOSIS — R200 Anesthesia of skin: Secondary | ICD-10-CM | POA: Diagnosis not present

## 2019-11-24 MED ORDER — HYDROCHLOROTHIAZIDE 25 MG PO TABS: 25.0000 mg | ORAL_TABLET | Freq: Every day | ORAL | 3 refills | Status: DC

## 2019-11-24 MED ORDER — IRBESARTAN 300 MG PO TABS: 300.0000 mg | ORAL_TABLET | Freq: Every day | ORAL | 3 refills | Status: DC

## 2019-11-24 MED ORDER — ALLOPURINOL 300 MG PO TABS: 300.0000 mg | ORAL_TABLET | Freq: Every day | ORAL | 3 refills | Status: DC

## 2019-11-24 NOTE — Progress Notes (Signed)
OFFICE VISIT  11/24/2019   CC:  Chief Complaint  Patient presents with  . Follow-up    RCI, pt is not fasting   HPI:    Patient is a 44 y.o. Caucasian male who presents for f/u HTN. Feeling well, home bp's normal consistently.  No prob with meds. 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 He has avoided seafood for the most part.  He reports that he woke up from sleeping one day and his left hand felt numb (? Weeks ago). It lasted several hours.  No focal weakness, no numbness or tingling anywhere else.  No dysarthria, swallowing probs, vision abnormalities, dizziness/balance issues.  No palpitations or heart racing.  No HAs.  He has never had anything like this before or since.  No hx of CTS.  Past Medical History:  Diagnosis Date  . Diarrhea 03/2015   GI eval (Dr. Dulce Sellar, [facility]) ?malabsorptive syndrome?  Work-up pending  . Dislocation, coccyx closed    [facility]  . Elevated transaminase level 2017, 2019   Very mild ALT elev: Hep serologies neg.  . Family history of colonic polyps    father  . HTN (hypertension)   . Nephrolithiasis   . Nonexertional chest pain    Ruled out in ED 09/2018.  Isolated occurence.    Past Surgical History:  Procedure Laterality Date  . EYE SURGERY     Lasik  . TONSILLECTOMY AND ADENOIDECTOMY  1980  . VASECTOMY  2014  . WISDOM TOOTH EXTRACTION      Outpatient Medications Prior to Visit  Medication Sig Dispense Refill  . aspirin EC 81 MG tablet Take 81 mg by mouth daily.    . phentermine 37.5 MG capsule Take by mouth. Patient takes half a tablet    . UNKNOWN TO PATIENT Take 1 tablet by mouth See admin instructions. Unnamed allergy tablet ([employer] brand): Take 1 tablet by mouth once a day as needed for allergies    . allopurinol (ZYLOPRIM) 100 MG tablet TAKE 2 TABLETS BY MOUTH EVERY DAY 60 tablet 0   . hydrochlorothiazide (HYDRODIURIL) 25 MG tablet TAKE 1 TABLET BY MOUTH EVERY DAY 30 tablet 0  . irbesartan (AVAPRO) 300 MG tablet TAKE 1 TABLET BY MOUTH EVERY DAY 90 tablet 1  . predniSONE (DELTASONE) 20 MG tablet 2 tabs po qd x 5d (Patient not taking: Reported on 11/24/2019) 10 tablet 0   No facility-administered medications prior to visit.    Allergies  Allergen Reactions  . Lotrel [Amlodipine Besy-Benazepril Hcl] Other (See Comments) and Cough    Cough + sudden periods of severe fatigue    ROS As per HPI  PE: Blood pressure 114/83, pulse 83, temperature 98.6 F (37 C), temperature source Temporal, resp. rate 16, height 5\' 11"  (1.803 m), weight 244 lb 6.4 oz (110.9 kg), SpO2 95 %.  Wt Readings from Last 3 Encounters:  11/24/19 244 lb 6.4 oz (110.9 kg)  07/13/19 238 lb 9.6 oz (108.2 kg)  05/02/19 244 lb 8 oz (110.9 kg)   Temp Readings from Last 3 Encounters:  11/24/19 98.6 F (37 C) (Temporal)  07/13/19 98.6 F (37 C) (Temporal)  05/02/19 98.4 F (36.9 C) (Temporal)   BP Readings from Last 3 Encounters:  11/24/19 114/83  07/13/19 123/85  05/02/19 124/82   Pulse Readings from Last 3 Encounters:  11/24/19  83  07/13/19 86  05/02/19 83  Gen: Alert, well appearing.  Patient is oriented to person, place, time, and situation. AFFECT: pleasant, lucid thought and speech. Neck - No masses or thyromegaly or limitation in range of motion.  No bruits. CV: RRR, no m/r/g.   LUNGS: CTA bilat, nonlabored resps, good aeration in all lung fields. EXT: no clubbing or cyanosis.  no edema.  Phalen's and tinels neg bilat wrists. No focal neurologic deficit.  LABS:  Lab Results  Component Value Date   TSH 2.45 03/15/2015   Lab Results  Component Value Date   WBC 8.8 09/04/2018   HGB 15.9 09/04/2018   HCT 46.7 09/04/2018   MCV 84.1 09/04/2018   PLT 208 09/04/2018   Lab Results  Component Value Date   CREATININE 1.19 09/04/2018   BUN 15 09/04/2018   NA 137 09/04/2018   K  3.3 (L) 09/04/2018   CL 97 (L) 09/04/2018   CO2 25 09/04/2018   Lab Results  Component Value Date   ALT 74 (H) 04/14/2018   AST 36 04/14/2018   ALKPHOS 44 04/14/2018   BILITOT 0.8 04/14/2018   Lab Results  Component Value Date   CHOL 174 04/14/2018   Lab Results  Component Value Date   HDL 38.60 (L) 04/14/2018   Lab Results  Component Value Date   LDLCALC 99 04/14/2018   Lab Results  Component Value Date   TRIG 183.0 (H) 04/14/2018   Lab Results  Component Value Date   CHOLHDL 5 04/14/2018    IMPRESSION AND PLAN:  1) HTN: well controlled.  Continue hctz and irbesartan at current doses. He'll return for lytes/cr when he comes back for fasting HP labs at his earliest convenience. RFd bp meds today.  2) Gout: improved on allopurinol 200 mg qd for the last 5 mo. However, he had one mild flare.  I want him to increase his allopurinol to 300mg  tab once daily. rx done today. Uric acid level will be done with upcoming labs.  3) Hand numbness.  One episode, no other neurologic abnormality. Suspect he slept with wrist hyper-flexed and compressed the median nerve. Monitor. Signs/symptoms to call or return for were reviewed and pt expressed understanding.   An After Visit Summary was printed and given to the patient.  FOLLOW UP: Return in about 6 months (around 05/25/2020) for routine chronic illness f/u.  Signed:  Crissie Sickles, MD           11/24/2019

## 2020-03-28 ENCOUNTER — Encounter (HOSPITAL_BASED_OUTPATIENT_CLINIC_OR_DEPARTMENT_OTHER): Payer: Self-pay | Admitting: Emergency Medicine

## 2020-03-28 ENCOUNTER — Emergency Department (HOSPITAL_BASED_OUTPATIENT_CLINIC_OR_DEPARTMENT_OTHER)
Admission: EM | Admit: 2020-03-28 | Discharge: 2020-03-28 | Disposition: A | Discharge status: Home / Self Care | Payer: BC Managed Care – PPO | Attending: Emergency Medicine | Admitting: Emergency Medicine

## 2020-03-28 ENCOUNTER — Other Ambulatory Visit: Payer: Self-pay

## 2020-03-28 DIAGNOSIS — Z79899 Other long term (current) drug therapy: Secondary | ICD-10-CM | POA: Insufficient documentation

## 2020-03-28 DIAGNOSIS — R5383 Other fatigue: Secondary | ICD-10-CM | POA: Insufficient documentation

## 2020-03-28 DIAGNOSIS — Z7982 Long term (current) use of aspirin: Secondary | ICD-10-CM | POA: Insufficient documentation

## 2020-03-28 DIAGNOSIS — A084 Viral intestinal infection, unspecified: Secondary | ICD-10-CM | POA: Diagnosis not present

## 2020-03-28 DIAGNOSIS — I10 Essential (primary) hypertension: Secondary | ICD-10-CM | POA: Insufficient documentation

## 2020-03-28 DIAGNOSIS — R112 Nausea with vomiting, unspecified: Secondary | ICD-10-CM | POA: Diagnosis present

## 2020-03-28 DIAGNOSIS — E876 Hypokalemia: Secondary | ICD-10-CM | POA: Insufficient documentation

## 2020-03-28 LAB — URINALYSIS, MICROSCOPIC (REFLEX)

## 2020-03-28 LAB — URINALYSIS, ROUTINE W REFLEX MICROSCOPIC
Bilirubin Urine: NEGATIVE
Glucose, UA: NEGATIVE mg/dL
Ketones, ur: NEGATIVE mg/dL
Leukocytes,Ua: NEGATIVE
Nitrite: NEGATIVE
Protein, ur: 30 mg/dL — AB
Specific Gravity, Urine: 1.025 (ref 1.005–1.030)
pH: 5.5 (ref 5.0–8.0)

## 2020-03-28 LAB — CBC
HCT: 50.2 % (ref 39.0–52.0)
Hemoglobin: 17.7 g/dL — ABNORMAL HIGH (ref 13.0–17.0)
MCH: 29.1 pg (ref 26.0–34.0)
MCHC: 35.3 g/dL (ref 30.0–36.0)
MCV: 82.6 fL (ref 80.0–100.0)
Platelets: 280 10*3/uL (ref 150–400)
RBC: 6.08 MIL/uL — ABNORMAL HIGH (ref 4.22–5.81)
RDW: 12.2 % (ref 11.5–15.5)
WBC: 14.3 10*3/uL — ABNORMAL HIGH (ref 4.0–10.5)
nRBC: 0 % (ref 0.0–0.2)

## 2020-03-28 LAB — LIPASE, BLOOD: Lipase: 32 U/L (ref 11–51)

## 2020-03-28 LAB — COMPREHENSIVE METABOLIC PANEL
ALT: 54 U/L — ABNORMAL HIGH (ref 0–44)
AST: 29 U/L (ref 15–41)
Albumin: 4.9 g/dL (ref 3.5–5.0)
Alkaline Phosphatase: 54 U/L (ref 38–126)
Anion gap: 12 (ref 5–15)
BUN: 22 mg/dL — ABNORMAL HIGH (ref 6–20)
CO2: 20 mmol/L — ABNORMAL LOW (ref 22–32)
Calcium: 9.8 mg/dL (ref 8.9–10.3)
Chloride: 99 mmol/L (ref 98–111)
Creatinine, Ser: 1.34 mg/dL — ABNORMAL HIGH (ref 0.61–1.24)
GFR calc Af Amer: >60 mL/min (ref >60)
GFR calc non Af Amer: >60 mL/min (ref >60)
Glucose, Bld: 160 mg/dL — ABNORMAL HIGH (ref 70–99)
Potassium: 3.1 mmol/L — ABNORMAL LOW (ref 3.5–5.1)
Sodium: 131 mmol/L — ABNORMAL LOW (ref 135–145)
Total Bilirubin: 1.3 mg/dL — ABNORMAL HIGH (ref 0.3–1.2)
Total Protein: 8.6 g/dL — ABNORMAL HIGH (ref 6.5–8.1)

## 2020-03-28 MED ORDER — SODIUM CHLORIDE 0.9 % IV BOLUS
1000.0000 mL | Freq: Once | INTRAVENOUS | Status: AC
  Administered 2020-03-28: 1000 mL via INTRAVENOUS

## 2020-03-28 MED ORDER — POTASSIUM CHLORIDE CRYS ER 20 MEQ PO TBCR: 40.0000 meq | EXTENDED_RELEASE_TABLET | Freq: Two times a day (BID) | ORAL | 0 refills | Status: DC

## 2020-03-28 MED ORDER — POTASSIUM CHLORIDE CRYS ER 20 MEQ PO TBCR
40.0000 meq | EXTENDED_RELEASE_TABLET | Freq: Once | ORAL | Status: AC
  Administered 2020-03-28: 40 meq via ORAL
  Filled 2020-03-28: qty 2

## 2020-03-28 MED ORDER — ONDANSETRON HCL 4 MG/2ML IJ SOLN
4.0000 mg | Freq: Once | INTRAMUSCULAR | Status: AC
  Administered 2020-03-28: 4 mg via INTRAVENOUS
  Filled 2020-03-28: qty 2

## 2020-03-28 MED ORDER — LOPERAMIDE HCL 2 MG PO CAPS
4.0000 mg | ORAL_CAPSULE | Freq: Once | ORAL | Status: AC
  Administered 2020-03-28: 4 mg via ORAL
  Filled 2020-03-28: qty 2

## 2020-03-28 MED ORDER — ONDANSETRON 8 MG PO TBDP: 8.0000 mg | ORAL_TABLET | Freq: Three times a day (TID) | ORAL | 0 refills | Status: DC | PRN

## 2020-03-28 NOTE — ED Provider Notes (Signed)
[facility] Provider Note: Hunter Dell, MD, FACEP  CSN: [id number] MRN: [id number] ARRIVAL: 03/28/20 at 0242 ROOM: [facility]   CHIEF COMPLAINT  Diarrhea   HISTORY OF PRESENT ILLNESS  03/28/20 5:24 AM Hunter Wiley is a 44 y.o. male who developed nausea, vomiting and diarrhea 3 days ago.  He this was associated with generalized fatigue and weakness but without abdominal pain or cramping.  This improved for 2 days and diarrhea and fatigue returned yesterday evening.  He also vomited one time this morning about 1:30 AM.  He is not presently nauseated.  He has not had a fever with this he has not had loss of taste or smell.  He has not had cough or shortness of breath.  Symptoms are moderate.  Describes the diarrhea as yellowish but without mucus or blood.   Past Medical History:  Diagnosis Date  . Diarrhea 03/2015   GI eval (Dr. Dulce Sellar, [facility]) ?malabsorptive syndrome?  Work-up pending  . Dislocation, coccyx closed    [facility]  . Elevated transaminase level 2017, 2019   Very mild ALT elev: Hep serologies neg.  . Family history of colonic polyps    father  . HTN (hypertension)   . Nephrolithiasis   . Nonexertional chest pain    Ruled out in ED 09/2018.  Isolated occurence.    Past Surgical History:  Procedure Laterality Date  . EYE SURGERY     Lasik  . TONSILLECTOMY AND ADENOIDECTOMY  1980  . VASECTOMY  2014  . WISDOM TOOTH EXTRACTION      Family History  Problem Relation Age of Onset  . Hypertension Mother   . Diabetes Mother   . Heart attack Mother        Normal coronaries @ cath  . Hypertension Father   . Lung cancer Maternal Grandfather        mesothelioma  . Heart attack Paternal Grandfather        in 49s    Social History   Tobacco Use  . Smoking status: Never Smoker  . Smokeless tobacco: Never Used  Vaping Use  . Vaping Use: Never used  Substance Use Topics  . Alcohol use: No  . Drug use: No    Prior to Admission medications     Medication Sig Start Date End Date Taking? Authorizing Provider  allopurinol (ZYLOPRIM) 300 MG tablet Take 1 tablet (300 mg total) by mouth daily. 11/24/19   McGowen, Maryjean Morn, MD  aspirin EC 81 MG tablet Take 81 mg by mouth daily.    [provider]  hydrochlorothiazide (HYDRODIURIL) 25 MG tablet Take 1 tablet (25 mg total) by mouth daily. 11/24/19   McGowen, Maryjean Morn, MD  irbesartan (AVAPRO) 300 MG tablet Take 1 tablet (300 mg total) by mouth daily. 11/24/19   McGowen, Maryjean Morn, MD  ondansetron (ZOFRAN ODT) 8 MG disintegrating tablet Take 1 tablet (8 mg total) by mouth every 8 (eight) hours as needed for nausea or vomiting. 03/28/20   Kehinde Totzke, MD  phentermine 37.5 MG capsule Take by mouth. Patient takes half a tablet    [provider]  potassium chloride SA (KLOR-CON) 20 MEQ tablet Take 2 tablets (40 mEq total) by mouth 2 (two) times daily for 3 days. 03/28/20 03/31/20  Norah Fick, MD    Allergies Lotrel [amlodipine besy-benazepril hcl]   REVIEW OF SYSTEMS  Negative except as noted here or in the History of Present Illness.   PHYSICAL EXAMINATION  Initial  Vital Signs Blood pressure (!) 141/102, pulse 90, temperature 97.9 F (36.6 C), temperature source Oral, resp. rate 17, height 5\' 11"  (1.803 m), weight 108.9 kg, SpO2 96 %.  Examination General: Well-developed, well-nourished male in no acute distress; appearance consistent with age of record HENT: normocephalic; atraumatic Eyes: pupils equal, round and reactive to light; extraocular muscles intact Neck: supple Heart: regular rate and rhythm Lungs: clear to auscultation bilaterally Abdomen: soft; nondistended; nontender; no masses or hepatosplenomegaly; bowel sounds present Extremities: No deformity; full range of motion; pulses normal Neurologic: Awake, alert and oriented; motor function intact in all extremities and symmetric; no facial droop Skin: Warm and dry Psychiatric: Normal mood and  affect   RESULTS  Summary of this visit's results, reviewed and interpreted by myself:   EKG Interpretation  Date/Time:    Ventricular Rate:    PR Interval:    QRS Duration:   QT Interval:    QTC Calculation:   R Axis:     Text Interpretation:        Laboratory Studies: Results for orders placed or performed during the hospital encounter of 03/28/20 (from the past 24 hour(s))  Lipase, blood     Status: None   Collection Time: 03/28/20  3:05 AM  Result Value Ref Range   Lipase 32 11 - 51 U/L  Comprehensive metabolic panel     Status: Abnormal   Collection Time: 03/28/20  3:05 AM  Result Value Ref Range   Sodium 131 (L) 135 - 145 mmol/L   Potassium 3.1 (L) 3.5 - 5.1 mmol/L   Chloride 99 98 - 111 mmol/L   CO2 20 (L) 22 - 32 mmol/L   Glucose, Bld 160 (H) 70 - 99 mg/dL   BUN 22 (H) 6 - 20 mg/dL   Creatinine, Ser 03/30/20 (H) 0.61 - 1.24 mg/dL   Calcium 9.8 8.9 - 0.10 mg/dL   Total Protein 8.6 (H) 6.5 - 8.1 g/dL   Albumin 4.9 3.5 - 5.0 g/dL   AST 29 15 - 41 U/L   ALT 54 (H) 0 - 44 U/L   Alkaline Phosphatase 54 38 - 126 U/L   Total Bilirubin 1.3 (H) 0.3 - 1.2 mg/dL   GFR calc non Af Amer >60 >60 mL/min   GFR calc Af Amer >60 >60 mL/min   Anion gap 12 5 - 15  CBC     Status: Abnormal   Collection Time: 03/28/20  3:05 AM  Result Value Ref Range   WBC 14.3 (H) 4.0 - 10.5 K/uL   RBC 6.08 (H) 4.22 - 5.81 MIL/uL   Hemoglobin 17.7 (H) 13.0 - 17.0 g/dL   HCT 03/30/20 39 - 52 %   MCV 82.6 80.0 - 100.0 fL   MCH 29.1 26.0 - 34.0 pg   MCHC 35.3 30.0 - 36.0 g/dL   RDW 35.5 73.2 - 20.2 %   Platelets 280 150 - 400 K/uL   nRBC 0.0 0.0 - 0.2 %   Imaging Studies: No results found.  ED COURSE and MDM  Nursing notes, initial and subsequent vitals signs, including pulse oximetry, reviewed and interpreted by myself.  Vitals:   03/28/20 0254 03/28/20 0300 03/28/20 0513  BP:  (!) 145/117 (!) 141/102  Pulse:  (!) 109 90  Resp:  16 17  Temp:  97.9 F (36.6 C)   TempSrc:  Oral   SpO2:   98% 96%  Weight: 108.9 kg    Height: 5\' 11"  (1.803 m)     Medications  sodium chloride 0.9 % bolus 1,000 mL (0 mLs Intravenous Stopped 03/28/20 0611)  ondansetron (ZOFRAN) injection 4 mg (4 mg Intravenous Given 03/28/20 0534)  potassium chloride SA (KLOR-CON) CR tablet 40 mEq (40 mEq Oral Given 03/28/20 0535)  sodium chloride 0.9 % bolus 1,000 mL (1,000 mLs Intravenous New Bag/Given 03/28/20 0612)  loperamide (IMODIUM) capsule 4 mg (4 mg Oral Given 03/28/20 0640)   6:44 AM Patient able to drink fluids without vomiting.  His second liter of fluid is infusing.  We have started repletion of his potassium.  Presentation consistent with a viral gastroenteritis.  He was advised to return should his symptoms worsen or his diarrhea become bloody or should he develop abdominal pain.   PROCEDURES  Procedures   ED DIAGNOSES     ICD-10-CM   1. Viral gastroenteritis  A08.4   2. Hypokalemia  E87.6        Freedom Lopezperez, Jonny Ruiz, MD 03/28/20 [phone]

## 2020-03-28 NOTE — ED Triage Notes (Signed)
Pt states he has had diarrhea x 4 days  Pt states he feels dehydrated and is cramping all over  Pt states he has had some vomiting as well

## 2020-03-28 NOTE — ED Notes (Signed)
Pt encouraged to void

## 2020-10-30 IMAGING — CR DG CHEST 2V
2 series · 2 of 2 positions shown · non-contrast
Comparison: None.

CLINICAL DATA: Left chest pain tonight with left arm pain and
shortness of breath. History of hypertension.

EXAM:
CHEST - 2 VIEW

[chest pa]
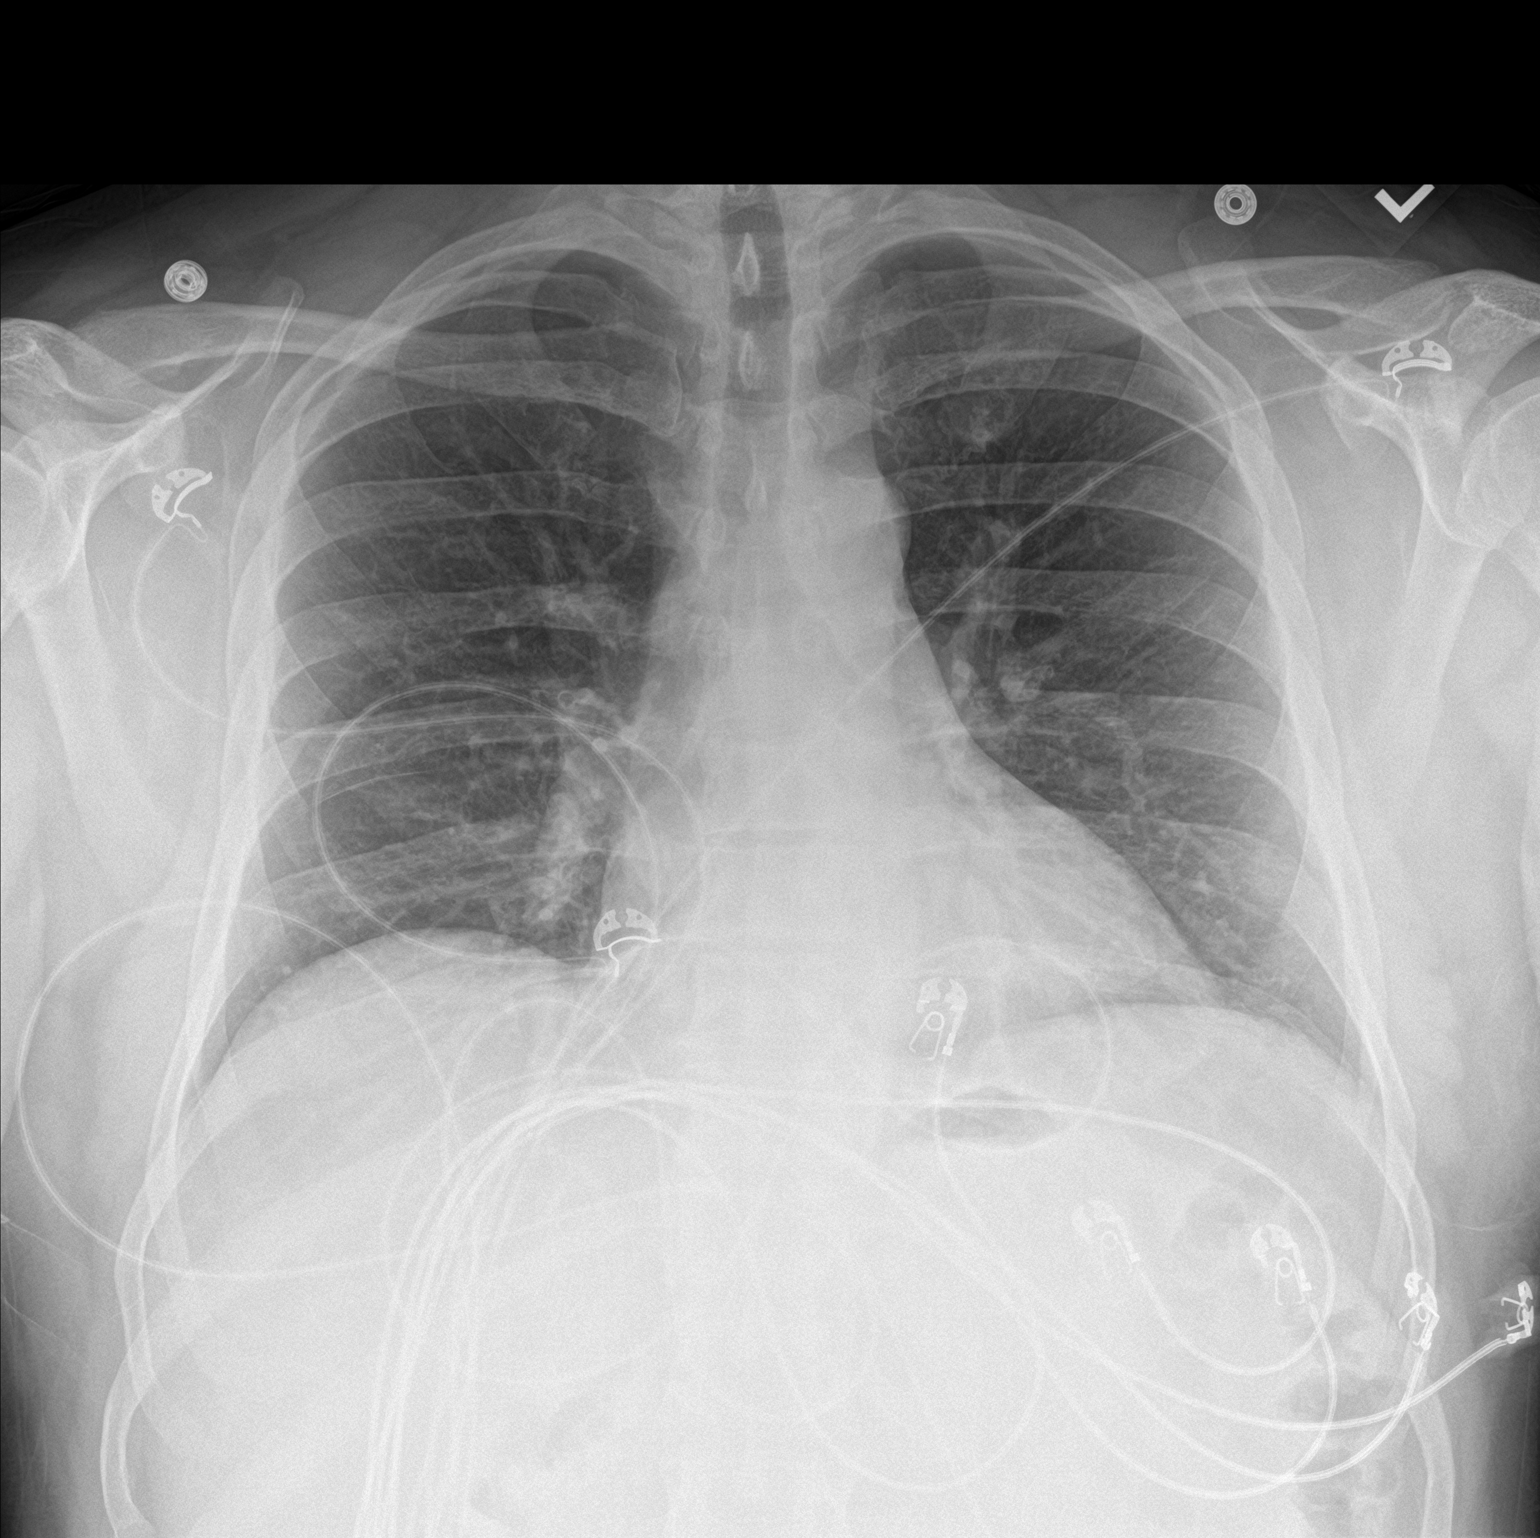

[chest lat]
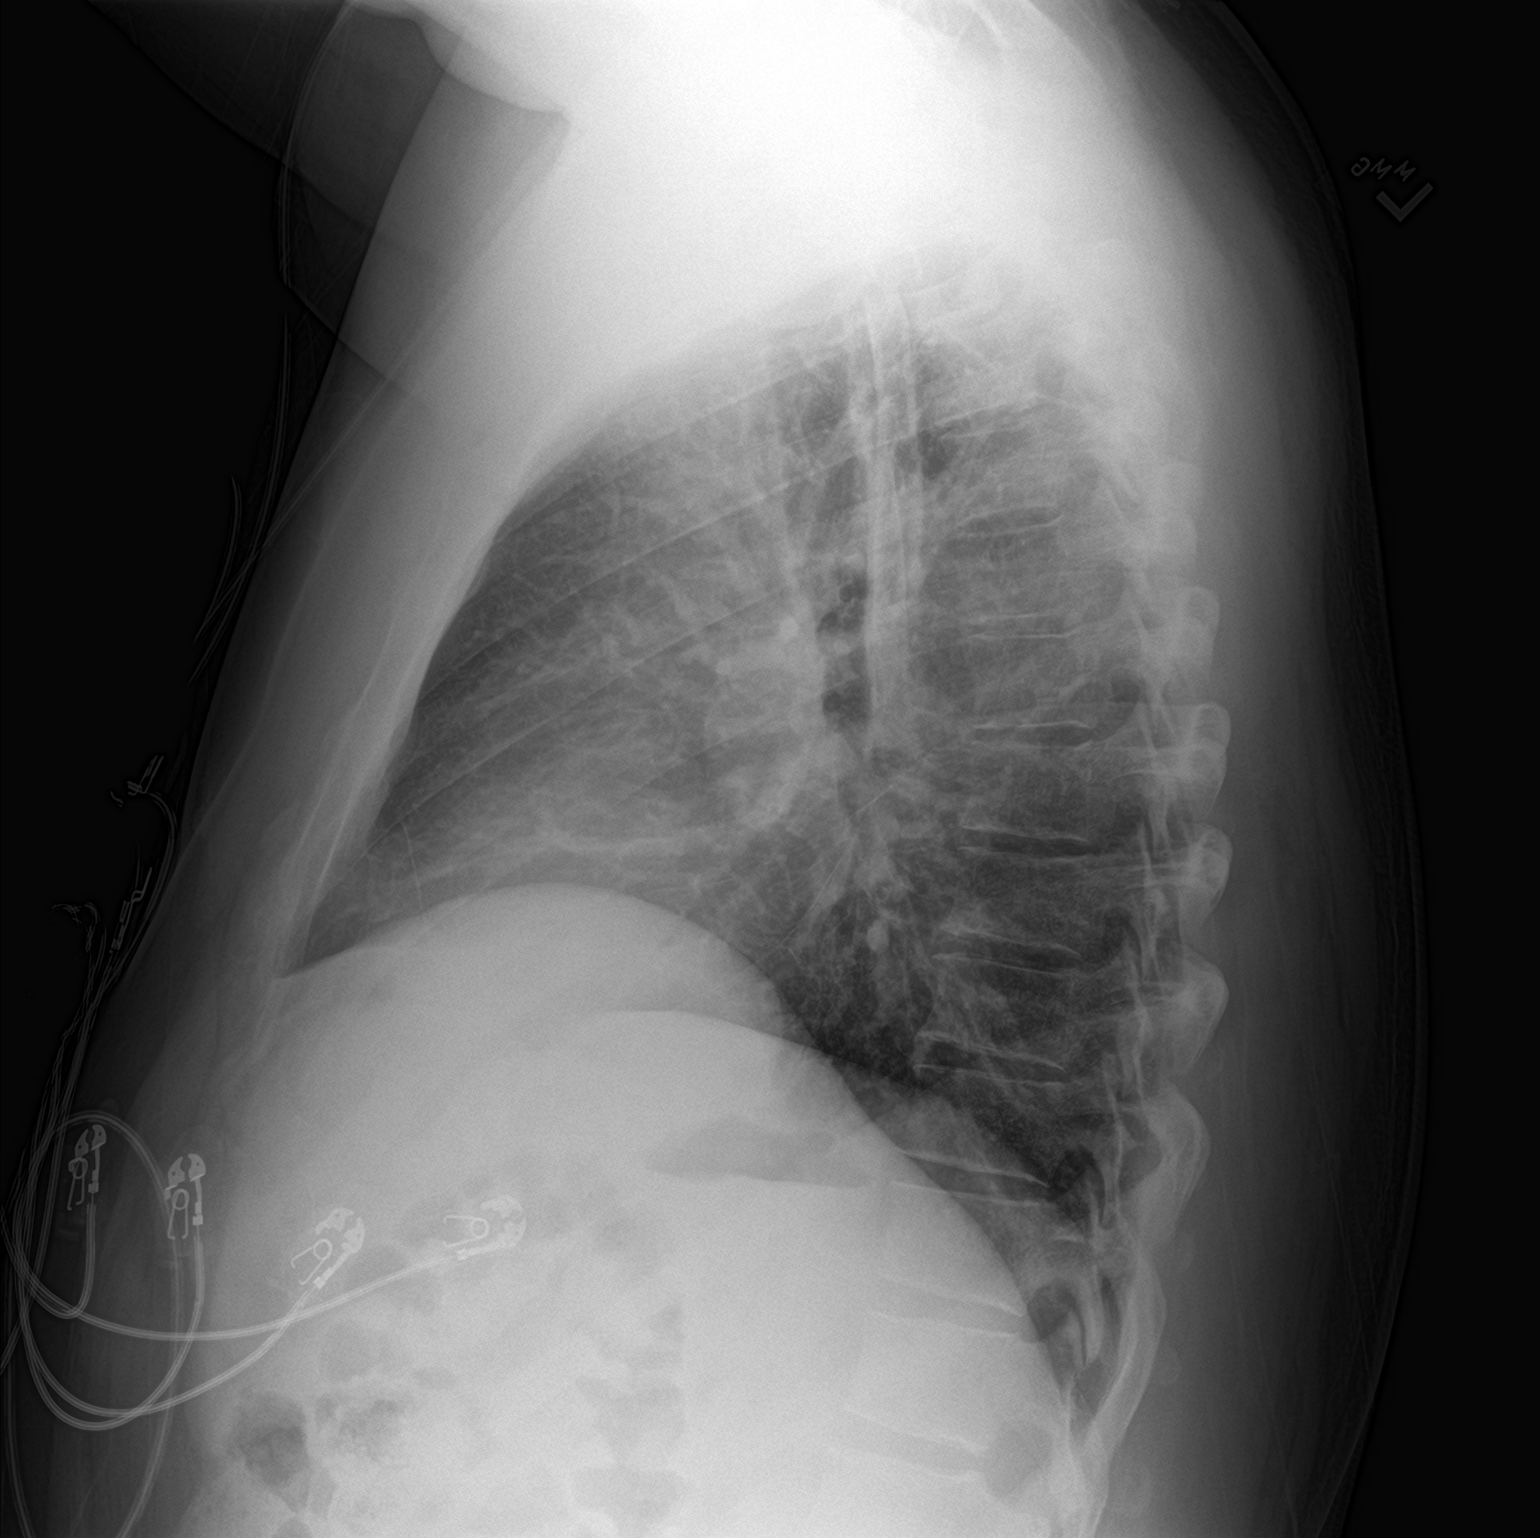

[2 of 2 positions shown; findings below may reference images not displayed]

FINDINGS: The heart size and mediastinal contours are within normal limits.
Both lungs are clear. The visualized skeletal structures are
unremarkable.
IMPRESSION: No active cardiopulmonary disease.

## 2020-11-15 ENCOUNTER — Other Ambulatory Visit: Payer: Self-pay | Admitting: Family Medicine

## 2020-12-12 ENCOUNTER — Other Ambulatory Visit: Payer: Self-pay | Admitting: Family Medicine

## 2020-12-15 ENCOUNTER — Other Ambulatory Visit: Payer: Self-pay | Admitting: Family Medicine

## 2020-12-29 ENCOUNTER — Other Ambulatory Visit: Payer: Self-pay | Admitting: Family Medicine

## 2021-01-02 ENCOUNTER — Encounter: Payer: Self-pay | Admitting: Family Medicine

## 2021-01-02 ENCOUNTER — Ambulatory Visit: Payer: BC Managed Care – PPO | Admitting: Family Medicine

## 2021-01-02 ENCOUNTER — Other Ambulatory Visit: Payer: Self-pay

## 2021-01-02 VITALS — BP 130/79 | HR 75 | Temp 97.8°F | Resp 16 | Ht 71.0 in | Wt 264.4 lb

## 2021-01-02 DIAGNOSIS — E669 Obesity, unspecified: Secondary | ICD-10-CM

## 2021-01-02 DIAGNOSIS — K219 Gastro-esophageal reflux disease without esophagitis: Secondary | ICD-10-CM

## 2021-01-02 DIAGNOSIS — M10079 Idiopathic gout, unspecified ankle and foot: Secondary | ICD-10-CM

## 2021-01-02 DIAGNOSIS — I10 Essential (primary) hypertension: Secondary | ICD-10-CM

## 2021-01-02 MED ORDER — ALLOPURINOL 300 MG PO TABS
300.0000 mg | ORAL_TABLET | Freq: Every day | ORAL | 3 refills | Status: DC
Start: 2021-01-02 — End: 2021-11-07

## 2021-01-02 MED ORDER — HYDROCHLOROTHIAZIDE 25 MG PO TABS
1.0000 | ORAL_TABLET | Freq: Every day | ORAL | 3 refills | Status: DC
Start: 2021-01-02 — End: 2021-11-07

## 2021-01-02 MED ORDER — PANTOPRAZOLE SODIUM 40 MG PO TBEC: 40.0000 mg | DELAYED_RELEASE_TABLET | Freq: Every day | ORAL | 1 refills | Status: DC

## 2021-01-02 NOTE — Progress Notes (Signed)
OFFICE VISIT  01/02/2021  CC:  Chief Complaint  Patient presents with  . Burning in throat    Mainly when eating or drinking; last week first noticed.     HPI:    Patient is a 45 y.o. Caucasian male who presents for "burning in throat". Onset about a week ago, woke up with feeling of burning in throat and it resolved later that day.  Each day since then has noted morning ST and burns in throat when swallowing.  Excessive stomach gas/burping a couple days recently.  No trouble swallowing, no distinct heartburn sensation, no cough, no hoarseness.  No n/v or abd pain. Not worse after eating.  No recent changes in his diet.  HTN: has not been taking his hctz recently (RF mess-up) nor has he been taking his  allopurinol (out x 1 wk)--he has not followed up for HTN in over a year so we had sent out 14d supply of irbesartan but must have inadvertently left out hctz and allopurinol. No home bp monitoring. Admits his diet is not good, has gained 20 lb in the last 15 mo.  No formal exercise but is active.  No recent gout flares.  This has been well controlled since getting on 300mg  allopurinol qd.  ROS as above, plus--> no fevers, no CP, no SOB, no wheezing, no cough, no dizziness, no HAs, no rashes, no melena/hematochezia.  No polyuria or polydipsia.  No myalgias or arthralgias.  No focal weakness, paresthesias, or tremors.  No acute vision or hearing abnormalities.  No dysuria or unusual/new urinary urgency or frequency.  No recent changes in lower legs. No n/v/d or abd pain.  No palpitations.      Past Medical History:  Diagnosis Date  . Diarrhea 03/2015   GI eval (Dr. 04/2015, [facility]) ?malabsorptive syndrome?  Work-up pending  . Dislocation, coccyx closed    [facility]  . Elevated transaminase level 2017, 2019   Very mild ALT elev: Hep serologies neg.  . Family history of colonic polyps    father  . Gout    R MTP jt  . HTN (hypertension)   . Nephrolithiasis   . Nonexertional chest  pain    Ruled out in ED 09/2018.  Isolated occurence.    Past Surgical History:  Procedure Laterality Date  . EYE SURGERY     Lasik  . TONSILLECTOMY AND ADENOIDECTOMY  1980  . VASECTOMY  2014  . WISDOM TOOTH EXTRACTION      Outpatient Medications Prior to Visit  Medication Sig Dispense Refill  . aspirin EC 81 MG tablet Take 81 mg by mouth daily.    . irbesartan (AVAPRO) 300 MG tablet TAKE 1 TABLET BY MOUTH EVERY DAY (Patient not taking: Reported on 01/02/2021) 14 tablet 0  . potassium chloride SA (KLOR-CON) 20 MEQ tablet Take 2 tablets (40 mEq total) by mouth 2 (two) times daily for 3 days. (Patient not taking: Reported on 01/02/2021) 6 tablet 0  . allopurinol (ZYLOPRIM) 300 MG tablet TAKE 1 TABLET BY MOUTH EVERY DAY (Patient not taking: Reported on 01/02/2021) 30 tablet 0  . hydrochlorothiazide (HYDRODIURIL) 25 MG tablet TAKE 1 TABLET BY MOUTH EVERY DAY (Patient not taking: Reported on 01/02/2021) 30 tablet 0  . ondansetron (ZOFRAN ODT) 8 MG disintegrating tablet Take 1 tablet (8 mg total) by mouth every 8 (eight) hours as needed for nausea or vomiting. (Patient not taking: Reported on 01/02/2021) 10 tablet 0  . phentermine 37.5 MG capsule Take by mouth.  Patient takes half a tablet (Patient not taking: Reported on 01/02/2021)     No facility-administered medications prior to visit.    Allergies  Allergen Reactions  . Lotrel [Amlodipine Besy-Benazepril Hcl] Other (See Comments) and Cough    Cough + sudden periods of severe fatigue    ROS As per HPI  PE: Vitals with BMI 01/02/2021 03/28/2020 03/28/2020  Height 5\' 11"  - -  Weight 264 lbs 6 oz - -  BMI 36.89 - -  Systolic 130 143  Diastolic 79 99 102  Pulse 75 87 90    Gen: Alert, well appearing.  Patient is oriented to person, place, time, and situation. AFFECT: pleasant, lucid thought and speech. 413: no injection, icteris, swelling, or exudate.  EOMI, PERRLA. Mouth: lips without lesion/swelling.  Oral mucosa pink and moist.  Oropharynx without erythema, exudate, or swelling.  Due to large tongue I could barely visualize any of his posterior pharyngeal wall, though.  Neck - No masses or thyromegaly or limitation in range of motion CV: RRR, no m/r/g.   LUNGS: CTA bilat, nonlabored resps, good aeration in all lung fields.   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 Plan on 1 mo daily treatment and if asymptomatic at that time we'll wean down to prn use.  2) HTN: controlled on hctz 25mg  qd and irbesartan 300 mg qd. BMET today (last sCr 03/2020 was in the setting of dehydration/acute gastroenteritis).  3) Gout: doing well on allopurinol 300mg  qd, RF'd today.  4) Class II obesity: wt gain of 20 lb in last 14 months. Discussed this today, pt notes poor diet and plans on changing this and also knows increase in exercise necessary for negative calorie balance.  An After Visit Summary was printed and given to the patient.  FOLLOW UP: Return in about 4 weeks (around 01/30/2021) for f/u GERD/LPR.  Signed:  , MD           01/02/2021

## 2021-01-02 NOTE — Patient Instructions (Signed)
Food Choices for Gastroesophageal Reflux Disease, Adult When you have gastroesophageal reflux disease (GERD), the foods you eat and your eating habits are very important. Choosing the right foods can help ease the discomfort of GERD. Consider working with a dietitian to help you make healthy food choices. What are tips for following this plan? Reading food labels  Look for foods that are low in saturated fat. Foods that have less than 5% of daily value (DV) of fat and 0 g of trans fats may help with your symptoms. Cooking  Cook foods using methods other than frying. This may include baking, steaming, grilling, or broiling. These are all methods that do not need a lot of fat for cooking.  To add flavor, try to use herbs that are low in spice and acidity. Meal planning  Choose healthy foods that are low in fat, such as fruits, vegetables, whole grains, low-fat dairy products, lean meats, fish, and poultry.  Eat frequent, small meals instead of three large meals each day. Eat your meals slowly, in a relaxed setting. Avoid bending over or lying down until 2-3 hours after eating.  Limit high-fat foods such as fatty meats or fried foods.  Limit your intake of fatty foods, such as oils, butter, and shortening.  Avoid the following as told by your health care provider: ? Foods that cause symptoms. These may be different for different people. Keep a food diary to keep track of foods that cause symptoms. ? Alcohol. ? Drinking large amounts of liquid with meals. ? Eating meals during the 2-3 hours before bed.   Lifestyle  Maintain a healthy weight. Ask your health care provider what weight is healthy for you. If you need to lose weight, work with your health care provider to do so safely.  Exercise for at least 30 minutes on 5 or more days each week, or as told by your health care provider.  Avoid wearing clothes that fit tightly around your waist and chest.  Do not use any products that  contain nicotine or tobacco. These products include cigarettes, chewing tobacco, and vaping devices, such as e-cigarettes. If you need help quitting, ask your health care provider.  Sleep with the head of your bed raised. Use a wedge under the mattress or blocks under the bed frame to raise the head of the bed.  Chew sugar-free gum after mealtimes. What foods should I eat? Eat a healthy, well-balanced diet of fruits, vegetables, whole grains, low-fat dairy products, lean meats, fish, and poultry. Each person is different. Foods that may trigger symptoms in one person may not trigger any symptoms in another person. Work with your health care provider to identify foods that are safe for you. The items listed above may not be a complete list of recommended foods and beverages. Contact a dietitian for more information.   What foods should I avoid? Limiting some of these foods may help manage the symptoms of GERD. Everyone is different. Consult a dietitian or your health care provider to help you identify the exact foods to avoid, if any. Fruits Any fruits prepared with added fat. Any fruits that cause symptoms. For some people this may include citrus fruits, such as oranges, grapefruit, pineapple, and lemons. Vegetables Deep-fried vegetables. French fries. Any vegetables prepared with added fat. Any vegetables that cause symptoms. For some people, this may include tomatoes and tomato products, chili peppers, onions and garlic, and horseradish. Grains Pastries or quick breads with added fat. Meats and other proteins High-fat   meats, such as fatty beef or pork, hot dogs, ribs, ham, sausage, salami, and bacon. Fried meat or protein, including fried fish and fried chicken. Nuts and nut butters, in large amounts. Dairy Whole milk and chocolate milk. Sour cream. Cream. Ice cream. Cream cheese. Milkshakes. Fats and oils Butter. Margarine. Shortening. Ghee. Beverages Coffee and tea, with or without  caffeine. Carbonated beverages. Sodas. Energy drinks. Fruit juice made with acidic fruits, such as orange or grapefruit. Tomato juice. Alcoholic drinks. Sweets and desserts Chocolate and cocoa. Donuts. Seasonings and condiments Pepper. Peppermint and spearmint. Added salt. Any condiments, herbs, or seasonings that cause symptoms. For some people, this may include curry, hot sauce, or vinegar-based salad dressings. The items listed above may not be a complete list of foods and beverages to avoid. Contact a dietitian for more information. Questions to ask your health care provider Diet and lifestyle changes are usually the first steps that are taken to manage symptoms of GERD. If diet and lifestyle changes do not improve your symptoms, talk with your health care provider about taking medicines. Where to find more information  International Foundation for Gastrointestinal Disorders: aboutgerd.org Summary  When you have gastroesophageal reflux disease (GERD), food and lifestyle choices may be very helpful in easing the discomfort of GERD.  Eat frequent, small meals instead of three large meals each day. Eat your meals slowly, in a relaxed setting. Avoid bending over or lying down until 2-3 hours after eating.  Limit high-fat foods such as fatty meats or fried foods. This information is not intended to replace advice given to you by your health care provider. Make sure you discuss any questions you have with your health care provider. Document Revised: 01/30/2020 Document Reviewed: 01/30/2020 Elsevier Patient Education  2021 Elsevier Inc.  

## 2021-01-03 LAB — BASIC METABOLIC PANEL
BUN: 14 mg/dL (ref 7–25)
CO2: 24 mmol/L (ref 20–32)
Calcium: 9 mg/dL (ref 8.6–10.3)
Chloride: 108 mmol/L (ref 98–110)
Creat: 1.1 mg/dL (ref 0.60–1.35)
Glucose, Bld: 125 mg/dL — ABNORMAL HIGH (ref 65–99)
Potassium: 4 mmol/L (ref 3.5–5.3)
Sodium: 140 mmol/L (ref 135–146)

## 2021-01-07 ENCOUNTER — Ambulatory Visit: Payer: BC Managed Care – PPO | Admitting: Family Medicine

## 2021-01-24 ENCOUNTER — Other Ambulatory Visit: Payer: Self-pay | Admitting: Family Medicine

## 2021-02-12 ENCOUNTER — Other Ambulatory Visit: Payer: Self-pay | Admitting: Family Medicine

## 2021-04-04 ENCOUNTER — Encounter: Payer: Self-pay | Admitting: Family Medicine

## 2021-04-04 ENCOUNTER — Ambulatory Visit: Payer: Self-pay | Admitting: Family Medicine

## 2021-04-04 ENCOUNTER — Other Ambulatory Visit: Payer: Self-pay

## 2021-04-04 VITALS — BP 120/87 | HR 93 | Temp 97.6°F | Ht 71.0 in | Wt 255.4 lb

## 2021-04-04 DIAGNOSIS — Z8619 Personal history of other infectious and parasitic diseases: Secondary | ICD-10-CM

## 2021-04-04 DIAGNOSIS — A09 Infectious gastroenteritis and colitis, unspecified: Secondary | ICD-10-CM

## 2021-04-04 DIAGNOSIS — R5081 Fever presenting with conditions classified elsewhere: Secondary | ICD-10-CM

## 2021-04-04 HISTORY — DX: Personal history of other infectious and parasitic diseases: Z86.19

## 2021-04-04 LAB — COMPREHENSIVE METABOLIC PANEL
ALT: 49 U/L (ref 0–53)
AST: 22 U/L (ref 0–37)
Albumin: 4.1 g/dL (ref 3.5–5.2)
Alkaline Phosphatase: 52 U/L (ref 39–117)
BUN: 11 mg/dL (ref 6–23)
CO2: 33 mEq/L — ABNORMAL HIGH (ref 19–32)
Calcium: 9.4 mg/dL (ref 8.4–10.5)
Chloride: 95 mEq/L — ABNORMAL LOW (ref 96–112)
Creatinine, Ser: 1.22 mg/dL (ref 0.40–1.50)
GFR: 70.81 mL/min (ref >60.00)
Glucose, Bld: 117 mg/dL — ABNORMAL HIGH (ref 70–99)
Potassium: 3.2 mEq/L — ABNORMAL LOW (ref 3.5–5.1)
Sodium: 136 mEq/L (ref 135–145)
Total Bilirubin: 0.7 mg/dL (ref 0.2–1.2)
Total Protein: 7 g/dL (ref 6.0–8.3)

## 2021-04-04 LAB — CBC WITH DIFFERENTIAL/PLATELET
Basophils Absolute: 0 10*3/uL (ref 0.0–0.1)
Basophils Relative: 0.4 % (ref 0.0–3.0)
Eosinophils Absolute: 0.1 10*3/uL (ref 0.0–0.7)
Eosinophils Relative: 1 % (ref 0.0–5.0)
HCT: 48.5 % (ref 39.0–52.0)
Hemoglobin: 16.6 g/dL (ref 13.0–17.0)
Lymphocytes Relative: 14.3 % (ref 12.0–46.0)
Lymphs Abs: 0.8 10*3/uL (ref 0.7–4.0)
MCHC: 34.3 g/dL (ref 30.0–36.0)
MCV: 85.7 fl (ref 78.0–100.0)
Monocytes Absolute: 1 10*3/uL (ref 0.1–1.0)
Monocytes Relative: 17.8 % — ABNORMAL HIGH (ref 3.0–12.0)
Neutro Abs: 3.7 10*3/uL (ref 1.4–7.7)
Neutrophils Relative %: 66.5 % (ref 43.0–77.0)
Platelets: 151 10*3/uL (ref 150.0–400.0)
RBC: 5.66 Mil/uL (ref 4.22–5.81)
RDW: 13.6 % (ref 11.5–15.5)
WBC: 5.6 10*3/uL (ref 4.0–10.5)

## 2021-04-04 MED ORDER — DIPHENOXYLATE-ATROPINE 2.5-0.025 MG PO TABS: ORAL_TABLET | ORAL | 0 refills | Status: DC

## 2021-04-04 NOTE — Patient Instructions (Addendum)
Stop your blood pressure medications until you are feeling well again.  Bland Diet A bland diet consists of foods that are often soft and do not have a lot of fat, fiber, or extra seasonings. Foods without fat, fiber, or seasoning are easier for the body to digest. They are also less likely to irritate your mouth, throat, stomach, and other parts of your digestive system. A bland diet is sometimes called a BRAT diet. What is my plan? Your health care provider or food and nutrition specialist (dietitian) may recommend specific changes to your diet to prevent symptoms or to treat your symptoms. These changes may include: Eating small meals often. Cooking food until it is soft enough to chew easily. Chewing your food well. Drinking fluids slowly. Not eating foods that are very spicy, sour, or fatty. Not eating citrus fruits, such as oranges and grapefruit. What do I need to know about this diet? Eat a variety of foods from the bland diet food list. Do not follow a bland diet longer than needed. Ask your health care provider whether you should take vitamins or supplements. What foods can I eat? Grains Hot cereals, such as cream of wheat. Rice. Bread, crackers, or tortillas made from refined white flour. Vegetables Canned or cooked vegetables. Mashed or boiled potatoes. Fruits Bananas. Applesauce. Other types of cooked or canned fruit with the skin and seeds removed, such as canned peaches or pears. Meats and other proteins Scrambled eggs. Creamy peanut butter or other nut butters. Lean, well-cooked meats, such as chicken or fish. Tofu. Soups or broths. Dairy Low-fat dairy products, such as milk, cottage cheese, or yogurt. Beverages Water. Herbal tea. Horne juice. Fats and oils Mild salad dressings. Canola or olive oil. Sweets and desserts Pudding. Custard. Fruit gelatin. Ice cream. The items listed above may not be a complete list of recommended foods and beverages. Contact a dietitian  for more options. What foods are not recommended? Grains Whole grain breads and cereals. Vegetables Raw vegetables. Fruits Raw fruits, especially citrus, berries, or dried fruits. Dairy Whole fat dairy foods. Beverages Caffeinated drinks. Alcohol. Seasonings and condiments Strongly flavored seasonings or condiments. Hot sauce. Salsa. Other foods Spicy foods. Fried foods. Sour foods, such as pickled or fermented foods. Foods with high sugar content. Foods high in fiber. The items listed above may not be a complete list of foods and beverages to avoid. Contact a dietitian for more information. Summary A bland diet consists of foods that are often soft and do not have a lot of fat, fiber, or extra seasonings. Foods without fat, fiber, or seasoning are easier for the body to digest. Check with your health care provider to see how long you should follow this diet plan. It is not meant to be followed for long periods. This information is not intended to replace advice given to you by your health care provider. Make sure you discuss any questions you have with your health care provider. Document Revised: 08/19/2017 Document Reviewed: 08/19/2017 Elsevier Patient Education  2022 [employer].

## 2021-04-04 NOTE — Progress Notes (Signed)
OFFICE VISIT  04/04/2021  CC:  Chief Complaint  Patient presents with   Diarrhea    X 4 days along with fever, had covid and flu test completed at [facility]; negative results. Seen at [facility] [address]   HPI:    Patient is a 45 y.o. Caucasian male who presents for diarrhea. Onset of fever and non-bloody diarrhea 4 d/a.  No abd pain or N/V.  Went to UC first night of illness.   Covid and flu tests done and both neg. Diarrhea and fever up to 101 have continued daily since onset.  No appetite. Went back to UC 2nd night of illness and blood tests and urine tests were done and pt states all normal---no records available at this time. No recent abx or sick contacts.  No recent uncooked or new/unusual foods eaten. Sx's don't seem to be letting up.  ROS as above, plus--> no CP, no SOB, no wheezing, no cough, no dizziness, no HAs, no rashes, no melena.  No polyuria or polydipsia.  No myalgias or arthralgias.  No focal weakness, paresthesias, or tremors.  No acute vision or hearing abnormalities.  No dysuria or unusual/new urinary urgency or frequency.  No recent changes in lower legs. No palpitations.     Past Medical History:  Diagnosis Date   Diarrhea 03/2015   GI eval (Dr. Dulce Sellar, [facility]) ?malabsorptive syndrome?  Work-up pending   Dislocation, coccyx closed    [facility]   Elevated transaminase level 2017, 2019   Very mild ALT elev: Hep serologies neg.   Family history of colonic polyps    father   Gout    R MTP jt   HTN (hypertension)    Nephrolithiasis    Nonexertional chest pain    Ruled out in ED 09/2018.  Isolated occurence.    Past Surgical History:  Procedure Laterality Date   EYE SURGERY     Lasik   TONSILLECTOMY AND ADENOIDECTOMY  1980   VASECTOMY  2014   WISDOM TOOTH EXTRACTION      Outpatient Medications Prior to Visit  Medication Sig Dispense Refill   allopurinol (ZYLOPRIM) 300 MG tablet Take 1 tablet (300 mg total) by mouth daily. 90 tablet 3   aspirin EC  81 MG tablet Take 81 mg by mouth daily.     hydrochlorothiazide (HYDRODIURIL) 25 MG tablet Take 1 tablet (25 mg total) by mouth daily. 90 tablet 3   irbesartan (AVAPRO) 300 MG tablet TAKE 1 TABLET BY MOUTH EVERY DAY 90 tablet 3   pantoprazole (PROTONIX) 40 MG tablet TAKE 1 TABLET BY MOUTH EVERY DAY 90 tablet 1   potassium chloride SA (KLOR-CON) 20 MEQ tablet Take 2 tablets (40 mEq total) by mouth 2 (two) times daily for 3 days. (Patient not taking: No sig reported) 6 tablet 0   No facility-administered medications prior to visit.    Allergies  Allergen Reactions   Lotrel [Amlodipine Besy-Benazepril Hcl] Other (See Comments) and Cough    Cough + sudden periods of severe fatigue    ROS As per HPI  PE: Vitals with BMI 04/04/2021 01/02/2021 03/28/2020  Height 5\' 11"  5\' 11"  -  Weight 255 lbs 6 oz 264 lbs 6 oz -  BMI 35.64 36.89 -  Systolic 120 130  Diastolic 87 79 99  Pulse 93 75 87   Gen: Alert, well appearing.  Patient is oriented to person, place, time, and situation. AFFECT: pleasant, lucid thought and speech. : no injection,  icteris, swelling, or exudate.  EOMI, PERRLA. Mouth: lips without lesion/swelling.  Oral mucosa pink and moist. Oropharynx without erythema, exudate, or swelling.  CV: RRR, no m/r/g.   LUNGS: CTA bilat, nonlabored resps, good aeration in all lung fields. ABD: soft, NT, ND, BS quite in upper quadrants, essentially normal in lower quadrants.  No hepatospenomegaly or mass.  No bruits. EXT: no clubbing or cyanosis.  no edema.  Skin - no sores or suspicious lesions or rashes or color changes   LABS:    Chemistry      Component Value Date/Time   NA 140 01/02/2021 1605   NA 139 03/15/2015 0000   K 4.0 01/02/2021 1605   CL 108 01/02/2021 1605   CO2 24 01/02/2021 1605   BUN 14 01/02/2021 1605   BUN 15 03/15/2015 0000   CREATININE 1.10 01/02/2021 1605   GLU 87 03/15/2015 0000      Component Value Date/Time   CALCIUM 9.0 01/02/2021 1605   ALKPHOS  54 03/28/2020 0305   AST 29 03/28/2020 0305   ALT 54 (H) 03/28/2020 0305   BILITOT 1.3 (H) 03/28/2020 0305      IMPRESSION AND PLAN:  Acute febrile diarrhea illness, presumed infectious--likely viral. Will check stool for c.diff pcr, bacterial culture, fecal lactoferrin. Recheck CBC, CMET and get UC records. Appropriate use of lomotil discussed, rx sent in. Bland diet discussed, importance of hydration discussed. Hold bp meds while ill.  An After Visit Summary was printed and given to the patient.  FOLLOW UP: No follow-ups on file.  Signed:  Santiago Bumpers, MD           04/04/2021

## 2021-04-05 NOTE — Addendum Note (Signed)
Addended by: Paschal Dopp on: 04/05/2021 03:09 PM   Modules accepted: Orders

## 2021-04-09 LAB — FECAL LACTOFERRIN, QUANT
Fecal Lactoferrin: POSITIVE — AB
MICRO NUMBER:: 12327950
SPECIMEN QUALITY:: ADEQUATE

## 2021-04-09 LAB — STOOL CULTURE: E coli, Shiga toxin Assay: NEGATIVE

## 2021-04-09 LAB — CLOSTRIDIUM DIFFICILE TOXIN B, QUALITATIVE, REAL-TIME PCR: Toxigenic C. Difficile by PCR: NOT DETECTED

## 2021-04-09 MED ORDER — CIPROFLOXACIN HCL 500 MG PO TABS: 500.0000 mg | ORAL_TABLET | Freq: Two times a day (BID) | ORAL | 0 refills | Status: DC

## 2021-04-09 NOTE — Addendum Note (Signed)
Addended by: Paschal Dopp on: 04/09/2021 01:31 PM   Modules accepted: Orders

## 2021-04-25 ENCOUNTER — Other Ambulatory Visit: Payer: Self-pay

## 2021-04-25 ENCOUNTER — Ambulatory Visit
Admission: RE | Admit: 2021-04-25 | Discharge: 2021-04-25 | Disposition: A | Discharge status: Home / Self Care | Payer: BC Managed Care – PPO | Source: Ambulatory Visit | Attending: Allergy | Admitting: Allergy

## 2021-04-25 ENCOUNTER — Other Ambulatory Visit: Payer: Self-pay | Admitting: Allergy

## 2021-04-25 DIAGNOSIS — R059 Cough, unspecified: Secondary | ICD-10-CM

## 2021-04-26 ENCOUNTER — Encounter: Payer: Self-pay | Admitting: Family Medicine

## 2021-05-23 ENCOUNTER — Encounter: Payer: Self-pay | Admitting: Family Medicine

## 2021-05-23 LAB — STATE LABORATORY REPORT

## 2021-08-01 ENCOUNTER — Other Ambulatory Visit: Payer: Self-pay | Admitting: Family Medicine

## 2021-11-02 ENCOUNTER — Other Ambulatory Visit: Payer: Self-pay | Admitting: Family Medicine

## 2021-11-11 ENCOUNTER — Ambulatory Visit: Payer: BC Managed Care – PPO | Admitting: Family Medicine

## 2021-11-11 ENCOUNTER — Encounter: Payer: Self-pay | Admitting: Family Medicine

## 2021-11-11 VITALS — BP 135/80 | HR 82 | Ht 72.5 in | Wt 262.6 lb

## 2021-11-11 DIAGNOSIS — Z1211 Encounter for screening for malignant neoplasm of colon: Secondary | ICD-10-CM | POA: Diagnosis not present

## 2021-11-11 DIAGNOSIS — I10 Essential (primary) hypertension: Secondary | ICD-10-CM

## 2021-11-11 DIAGNOSIS — M1000 Idiopathic gout, unspecified site: Secondary | ICD-10-CM | POA: Diagnosis not present

## 2021-11-11 DIAGNOSIS — Z0000 Encounter for general adult medical examination without abnormal findings: Secondary | ICD-10-CM | POA: Diagnosis not present

## 2021-11-11 DIAGNOSIS — Z9189 Other specified personal risk factors, not elsewhere classified: Secondary | ICD-10-CM | POA: Diagnosis not present

## 2021-11-11 MED ORDER — HYDROCHLOROTHIAZIDE 25 MG PO TABS
25.0000 mg | ORAL_TABLET | Freq: Every day | ORAL | 1 refills | Status: DC
Start: 2021-11-11 — End: 2022-05-12

## 2021-11-11 MED ORDER — SILDENAFIL CITRATE 50 MG PO TABS: ORAL_TABLET | ORAL | 5 refills | Status: DC

## 2021-11-11 MED ORDER — IRBESARTAN 300 MG PO TABS: 300.0000 mg | ORAL_TABLET | Freq: Every day | ORAL | 1 refills | Status: DC

## 2021-11-11 MED ORDER — ALLOPURINOL 300 MG PO TABS: 300.0000 mg | ORAL_TABLET | Freq: Every day | ORAL | 1 refills | Status: DC

## 2021-11-11 NOTE — Progress Notes (Signed)
Office Note ?11/11/2021 ? ?CC:  ?Chief Complaint  ?Patient presents with  ? Annual Exam  ? ?HPI:  ?Patient is a 46 y.o. male who is here for annual health maintenance exam and f/u HTN and gout. ? ?No problems with blood pressure medicines.  Compliant with blood pressure medications.  He is not exercising but does get about 10,000 steps a day at his job. ? ?No recent gout.  Patient stays on allopurinol 300 mg every day. ? ?He has concern of insidious onset of the last couple years of diminished firmness of erection.  Occasionally the erection will not last through intercourse.  Feels like this is happening more more.  Libido normal. ?He is not think that excessive stress or any performance anxiety are contributing. ? ? ?Past Medical History:  ?Diagnosis Date  ? Diarrhea 03/2015  ? GI eval (Dr. Dulce Sellar, [facility]) ?malabsorptive syndrome?  Work-up pending  ? Dislocation, coccyx closed   ? [facility]  ? Elevated transaminase level 2017, 2019  ? Very mild ALT elev: Hep serologies neg.  ? Family history of colonic polyps   ? father  ? Gout   ? R MTP jt  ? History of Salmonella gastroenteritis 04/2021  ? HTN (hypertension)   ? Nephrolithiasis   ? Nonexertional chest pain   ? Ruled out in ED 09/2018.  Isolated occurence.  ? Vasomotor rhinitis   ? ? ?Past Surgical History:  ?Procedure Laterality Date  ? EYE SURGERY    ? Lasik  ? TONSILLECTOMY AND ADENOIDECTOMY  1980  ? VASECTOMY  2014  ? WISDOM TOOTH EXTRACTION    ? ? ?Family History  ?Problem Relation Age of Onset  ? Hypertension Mother   ? Diabetes Mother   ? Heart attack Mother   ?     Normal coronaries @ cath  ? Hypertension Father   ? Lung cancer Maternal Grandfather   ?     mesothelioma  ? Heart attack Paternal Grandfather   ?     in 85s  ? ? ?Social History  ? ?Socioeconomic History  ? Marital status: Married  ?  Spouse name: Not on file  ? Number of children: Not on file  ? Years of education: Not on file  ? Highest education level: Not on file  ?Occupational  History  ? Not on file  ?Tobacco Use  ? Smoking status: Never  ? Smokeless tobacco: Never  ?Vaping Use  ? Vaping Use: Never used  ?Substance and Sexual Activity  ? Alcohol use: No  ? Drug use: No  ? Sexual activity: Not on file  ?Other Topics Concern  ? Not on file  ?Social History Narrative  ? Married, 2 sons.  ? Occupation: Publishing copy.  ? Lives in [place].  ? No T/A/Ds.  ? No FH of prostate or colon cancer  ? ?Social Determinants of Health  ? ?Financial Resource Strain: Not on file  ?Food Insecurity: Not on file  ?Transportation Needs: Not on file  ?Physical Activity: Not on file  ?Stress: Not on file  ?Social Connections: Not on file  ?Intimate Partner Violence: Not on file  ? ? ?Outpatient Medications Prior to Visit  ?Medication Sig Dispense Refill  ? aspirin EC 81 MG tablet Take 81 mg by mouth daily.    ? pantoprazole (PROTONIX) 40 MG tablet TAKE 1 TABLET (40 MG TOTAL) BY MOUTH DAILY. OFFICE VISIT NEEDED FOR FURTHER REFILLS 90 tablet 0  ? montelukast (SINGULAIR) 10 MG  tablet Take 10 mg by mouth daily.    ? allopurinol (ZYLOPRIM) 300 MG tablet Take 1 tablet (300 mg total) by mouth daily. 90 tablet 3  ? ciprofloxacin (CIPRO) 500 MG tablet Take 1 tablet (500 mg total) by mouth 2 (two) times daily. (Patient not taking: Reported on 11/11/2021) 14 tablet 0  ? diphenoxylate-atropine (LOMOTIL) 2.5-0.025 MG tablet 1-2 tabs tid prn diarrhea (Patient not taking: Reported on 11/11/2021) 30 tablet 0  ? hydrochlorothiazide (HYDRODIURIL) 25 MG tablet Take 1 tablet (25 mg total) by mouth daily. 90 tablet 3  ? irbesartan (AVAPRO) 300 MG tablet TAKE 1 TABLET BY MOUTH EVERY DAY 90 tablet 3  ? ondansetron (ZOFRAN-ODT) 4 MG disintegrating tablet Take 4 mg by mouth every 8 (eight) hours as needed. (Patient not taking: Reported on 11/11/2021)    ? traMADol (ULTRAM) 50 MG tablet Take 50 mg by mouth every 6 (six) hours as needed. (Patient not taking: Reported on 11/11/2021)    ? ?No facility-administered medications prior to  visit.  ? ? ?Allergies  ?Allergen Reactions  ? Lotrel [Amlodipine Besy-Benazepril Hcl] Other (See Comments) and Cough  ?  Cough + sudden periods of severe fatigue  ? ? ?ROS ?Review of Systems  ?Constitutional:  Negative for appetite change, chills, fatigue and fever.  ?HENT:  Negative for congestion, dental problem, ear pain and sore throat.   ?Eyes:  Negative for discharge, redness and visual disturbance.  ?Respiratory:  Negative for cough, chest tightness, shortness of breath and wheezing.   ?Cardiovascular:  Negative for chest pain, palpitations and leg swelling.  ?Gastrointestinal:  Negative for abdominal pain, blood in stool, diarrhea, nausea and vomiting.  ?Genitourinary:  Negative for difficulty urinating, dysuria, flank pain, frequency, hematuria and urgency.  ?Musculoskeletal:  Negative for arthralgias, back pain, joint swelling, myalgias and neck stiffness.  ?Skin:  Negative for pallor and rash.  ?Neurological:  Negative for dizziness, speech difficulty, weakness and headaches.  ?Hematological:  Negative for adenopathy. Does not bruise/bleed easily.  ?Psychiatric/Behavioral:  Negative for confusion and sleep disturbance. The patient is not nervous/anxious.   ? ?PE; ? ?  11/11/2021  ?  2:09 PM 04/04/2021  ?  9:36 AM 01/02/2021  ?  3:28 PM  ?Vitals with BMI  ?Height 6' 0.5" 5\' 11"  5\' 11"   ?Weight 262 lbs 10 oz 255 lbs 6 oz 264 lbs 6 oz  ?BMI 35.11 35.64 36.89  ?Systolic 135 120  ?Diastolic 80 87 79  ?Pulse 82 93 75  ? ?Gen: Alert, well appearing.  Patient is oriented to person, place, time, and situation. ?AFFECT: pleasant, lucid thought and speech. ?ENT: Ears: EACs clear, normal epithelium.  TMs with good light reflex and landmarks bilaterally.  Eyes: no injection, icteris, swelling, or exudate.  EOMI, PERRLA. ?Nose: no drainage or turbinate edema/swelling.  No injection or focal lesion.  Mouth: lips without lesion/swelling.  Oral mucosa pink and moist.  Dentition intact and without obvious caries or  gingival swelling.  Oropharynx without erythema, exudate, or swelling.  ?Neck: supple/nontender.  No LAD, mass, or TM.  Carotid pulses 2+ bilaterally, without bruits. ?CV: RRR, no m/r/g.   ?LUNGS: CTA bilat, nonlabored resps, good aeration in all lung fields. ?ABD: soft, NT, ND, BS normal.  No hepatospenomegaly or mass.  No bruits. ?EXT: no clubbing, cyanosis, or edema.  ?Musculoskeletal: no joint swelling, erythema, warmth, or tenderness.  ROM of all joints intact. ?Skin - no sores or suspicious lesions or rashes or color changes ? ?Pertinent labs:  ?Lab  Results  ?Component Value Date  ? TSH 2.45 03/15/2015  ? ?Lab Results  ?Component Value Date  ? WBC 5.6 04/04/2021  ? HGB 16.6 04/04/2021  ? HCT 48.5 04/04/2021  ? MCV 85.7 04/04/2021  ? PLT 151.0 04/04/2021  ? ?Lab Results  ?Component Value Date  ? CREATININE 1.22 04/04/2021  ? BUN 11 04/04/2021  ? NA 136 04/04/2021  ? K 3.2 (L) 04/04/2021  ? CL 95 (L) 04/04/2021  ? CO2 33 (H) 04/04/2021  ? ?Lab Results  ?Component Value Date  ? ALT 49 04/04/2021  ? AST 22 04/04/2021  ? ALKPHOS 52 04/04/2021  ? BILITOT 0.7 04/04/2021  ? ?Lab Results  ?Component Value Date  ? CHOL 174 04/14/2018  ? ?Lab Results  ?Component Value Date  ? HDL 38.60 (L) 04/14/2018  ? ?Lab Results  ?Component Value Date  ? LDLCALC 99 04/14/2018  ? ?Lab Results  ?Component Value Date  ? TRIG 183.0 (H) 04/14/2018  ? ?Lab Results  ?Component Value Date  ? CHOLHDL 5 04/14/2018  ? ?ASSESSMENT AND PLAN:  ? ?#1 hypertension, well controlled on irbesartan HCTZ 25 mg a day and irbesartan 300 mg a day. ?Patient to get electrolytes and creatinine when he returns for fasting labs. ? ?#2 gout.  Doing well long-term on allopurinol 300 mg a day. ? ?3.  Erectile dysfunction. ?Discussed trial of Viagra 50 mg, 1-2 daily as needed.  Prescription sent. ? ?#4 coronary artery disease risk stratification. ?Current risk factors are hypertension, obesity, and family history. ?Patient is interested in further risk  stratification. ?Discussed coronary calcium scoring and he does want to proceed.  Ordered today. ? ?#5 Health maintenance exam: ?Reviewed age and gender appropriate health maintenance issues (prudent diet, regular exercise, h

## 2021-11-11 NOTE — Patient Instructions (Signed)

## 2021-11-14 ENCOUNTER — Ambulatory Visit: Payer: BC Managed Care – PPO

## 2021-11-25 ENCOUNTER — Ambulatory Visit (INDEPENDENT_AMBULATORY_CARE_PROVIDER_SITE_OTHER): Payer: BC Managed Care – PPO

## 2021-11-25 DIAGNOSIS — I10 Essential (primary) hypertension: Secondary | ICD-10-CM

## 2021-11-25 LAB — CBC WITH DIFFERENTIAL/PLATELET
Basophils Absolute: 0 10*3/uL (ref 0.0–0.1)
Basophils Relative: 0.4 % (ref 0.0–3.0)
Eosinophils Absolute: 0.3 10*3/uL (ref 0.0–0.7)
Eosinophils Relative: 3.3 % (ref 0.0–5.0)
HCT: 46.2 % (ref 39.0–52.0)
Hemoglobin: 15.5 g/dL (ref 13.0–17.0)
Lymphocytes Relative: 19.3 % (ref 12.0–46.0)
Lymphs Abs: 1.7 10*3/uL (ref 0.7–4.0)
MCHC: 33.6 g/dL (ref 30.0–36.0)
MCV: 87.9 fl (ref 78.0–100.0)
Monocytes Absolute: 0.7 10*3/uL (ref 0.1–1.0)
Monocytes Relative: 7.9 % (ref 3.0–12.0)
Neutro Abs: 6 10*3/uL (ref 1.4–7.7)
Neutrophils Relative %: 69.1 % (ref 43.0–77.0)
Platelets: 177 10*3/uL (ref 150.0–400.0)
RBC: 5.25 Mil/uL (ref 4.22–5.81)
RDW: 13.9 % (ref 11.5–15.5)
WBC: 8.7 10*3/uL (ref 4.0–10.5)

## 2021-11-25 LAB — COMPREHENSIVE METABOLIC PANEL
ALT: 74 U/L — ABNORMAL HIGH (ref 0–53)
AST: 40 U/L — ABNORMAL HIGH (ref 0–37)
Albumin: 4.4 g/dL (ref 3.5–5.2)
Alkaline Phosphatase: 56 U/L (ref 39–117)
BUN: 11 mg/dL (ref 6–23)
CO2: 30 mEq/L (ref 19–32)
Calcium: 9.3 mg/dL (ref 8.4–10.5)
Chloride: 101 mEq/L (ref 96–112)
Creatinine, Ser: 1.09 mg/dL (ref 0.40–1.50)
GFR: 80.69 mL/min (ref >60.00)
Glucose, Bld: 101 mg/dL — ABNORMAL HIGH (ref 70–99)
Potassium: 3.7 mEq/L (ref 3.5–5.1)
Sodium: 140 mEq/L (ref 135–145)
Total Bilirubin: 1.1 mg/dL (ref 0.2–1.2)
Total Protein: 7 g/dL (ref 6.0–8.3)

## 2021-11-25 LAB — LIPID PANEL
Cholesterol: 215 mg/dL — ABNORMAL HIGH (ref 0–200)
HDL: 44.5 mg/dL (ref >39.00)
LDL Cholesterol: 139 mg/dL — ABNORMAL HIGH (ref 0–99)
NonHDL: 170.95
Total CHOL/HDL Ratio: 5
Triglycerides: 162 mg/dL — ABNORMAL HIGH (ref 0.0–149.0)
VLDL: 32.4 mg/dL (ref 0.0–40.0)

## 2021-11-25 LAB — TSH: TSH: 3 u[IU]/mL (ref 0.35–5.50)

## 2021-11-25 NOTE — Progress Notes (Signed)
Per the orders of  Dr.McGowen pt is here for labs, pt tolerated draw well.  

## 2021-11-26 ENCOUNTER — Telehealth: Payer: Self-pay

## 2021-11-26 DIAGNOSIS — R7401 Elevation of levels of liver transaminase levels: Secondary | ICD-10-CM

## 2021-11-26 NOTE — Telephone Encounter (Signed)
-----   Message from Jeoffrey Massed, MD sent at 11/26/2021  9:43 AM EDT ----- ?All labs excellent except one of his liver enzymes were just a tiny bit elevated. ?He has had a similar results in the past.  Viral hepatitis screening has been negative, which is good. ?Usually these very mild elevations are due to "fatty liver", which is simply fat cells replacing normal liver cells. ?This happens pretty commonly as people age, if they are overweight, and if the have high blood pressure or other chronic dz's. ?I recommend we confirm this with a liver ultrasound. ?Please order abdominal ultrasound limited right upper quadrant, imaging location of his choice, diagnosis elevated transaminase. ? ? ?

## 2021-12-02 ENCOUNTER — Telehealth: Payer: Self-pay

## 2021-12-02 ENCOUNTER — Ambulatory Visit (HOSPITAL_COMMUNITY)
Admission: RE | Admit: 2021-12-02 | Discharge: 2021-12-02 | Disposition: A | Discharge status: Home / Self Care | Payer: Self-pay | Source: Ambulatory Visit | Attending: Family Medicine | Admitting: Family Medicine

## 2021-12-02 DIAGNOSIS — Z9189 Other specified personal risk factors, not elsewhere classified: Secondary | ICD-10-CM | POA: Insufficient documentation

## 2021-12-02 DIAGNOSIS — I10 Essential (primary) hypertension: Secondary | ICD-10-CM | POA: Insufficient documentation

## 2021-12-02 NOTE — Telephone Encounter (Signed)
Please review and advise.

## 2021-12-02 NOTE — Telephone Encounter (Signed)
Patient called regarding CT and [place] that Dr. Milinda Cave has ordered.  Patient is going today for CT. ?Patient was given out of pocket expense for both CT and [place], CT oop (min. $100); [place] oop cost is $600+.  Patient is wanting to know if the [place] is a dire need at this time.  Can he hold off doing [place] because of out of pocket expense? ? ?Ultrasound is scheduled for tomorrow morning.  Please let him know if he cancel appt by end of business day so he will not get charged for appt. ? ?Please call [phone] ?

## 2021-12-02 NOTE — Telephone Encounter (Signed)
The ultrasound is not essential.  Okay to cancel. ?

## 2021-12-02 NOTE — Telephone Encounter (Signed)
Pt advised.

## 2021-12-03 ENCOUNTER — Other Ambulatory Visit: Payer: Self-pay | Admitting: Family Medicine

## 2021-12-03 ENCOUNTER — Ambulatory Visit (HOSPITAL_BASED_OUTPATIENT_CLINIC_OR_DEPARTMENT_OTHER): Payer: BC Managed Care – PPO

## 2021-12-03 ENCOUNTER — Encounter: Payer: Self-pay | Admitting: Family Medicine

## 2021-12-03 DIAGNOSIS — R931 Abnormal findings on diagnostic imaging of heart and coronary circulation: Secondary | ICD-10-CM

## 2021-12-03 DIAGNOSIS — E7800 Pure hypercholesterolemia, unspecified: Secondary | ICD-10-CM

## 2021-12-03 HISTORY — DX: Abnormal findings on diagnostic imaging of heart and coronary circulation: R93.1

## 2021-12-03 HISTORY — PX: OTHER SURGICAL HISTORY: SHX169

## 2021-12-03 MED ORDER — ROSUVASTATIN CALCIUM 10 MG PO TABS: 10.0000 mg | ORAL_TABLET | Freq: Every day | ORAL | 2 refills | Status: DC

## 2021-12-04 ENCOUNTER — Ambulatory Visit (INDEPENDENT_AMBULATORY_CARE_PROVIDER_SITE_OTHER): Payer: BC Managed Care – PPO | Admitting: Family Medicine

## 2021-12-04 ENCOUNTER — Encounter: Payer: Self-pay | Admitting: Family Medicine

## 2021-12-04 VITALS — BP 122/80 | HR 76 | Temp 98.1°F | Ht 72.5 in | Wt 261.6 lb

## 2021-12-04 DIAGNOSIS — E7800 Pure hypercholesterolemia, unspecified: Secondary | ICD-10-CM

## 2021-12-04 DIAGNOSIS — R931 Abnormal findings on diagnostic imaging of heart and coronary circulation: Secondary | ICD-10-CM

## 2021-12-04 DIAGNOSIS — R7301 Impaired fasting glucose: Secondary | ICD-10-CM

## 2021-12-04 NOTE — Progress Notes (Signed)
Virtual Visit via Video Note ? ?I connected with pt ? on 12/04/21 at  3:20 PM EDT by a video enabled telemedicine application and verified that I am speaking with the correct person using two identifiers. ? Location patient: [place] ?Location provider:work or home office ?Persons participating in the virtual visit: patient, provider ? ?I discussed the limitations and requested verbal permission for telemedicine visit. The patient expressed understanding and agreed to proceed. ? ?HPI: ?46 y/o male being seen today accompanied by [name] to discuss recent elevated coronary calcium score. ?Hunter Wiley had coronary calcium score in the 99th percentile on 12/03/2021. ?This imaging also detected some changes in the liver consistent with hepatic steatosis. ?This correlates with his intermittent mild elevation of transaminases. ? ?I talked with him yesterday and recommended starting rosuvastatin 10 mg. ?We discussed the test results again today and some of the potential side effects from rosuvastatin. ? ? ?Allergies  ?Allergen Reactions  ? Lotrel [Amlodipine Besy-Benazepril Hcl] Other (See Comments) and Cough  ?  Cough + sudden periods of severe fatigue  ? ?-COVID-19 vaccine status:  ?Immunization History  ?Administered Date(s) Administered  ? Influenza Split 04/21/2012  ? Influenza,inj,Quad PF,6+ Mos 05/02/2019  ? PFIZER(Purple Top)SARS-COV-2 Vaccination 10/20/2019, 11/10/2019  ? Tdap 09/19/2015  ? ?ROS: See pertinent positives and negatives per HPI. ? ?Past Medical History:  ?Diagnosis Date  ? Diarrhea 03/2015  ? GI eval (Dr. Paulita Fujita, [facility]) ?malabsorptive syndrome?  Work-up pending  ? Dislocation, coccyx closed   ? [facility]  ? Elevated coronary artery calcium score 12/03/2021  ? 99th %'tile->rosuvastatin started  ? Elevated transaminase level 2017, 2019  ? Very mild ALT elev: Hep serologies neg. hepatic steatosis noted on CT cardiac calcium score 12/2021.  ? Family history of colonic polyps   ? father  ? Gout   ? R MTP jt  ?  Hepatic steatosis   ? History of Salmonella gastroenteritis 04/2021  ? HTN (hypertension)   ? Nephrolithiasis   ? Nonexertional chest pain   ? Ruled out in ED 09/2018.  Isolated occurence.  ? Vasomotor rhinitis   ? ? ?Past Surgical History:  ?Procedure Laterality Date  ? Coronary calcium score  12/03/2021  ? 99th %'tile (LAD and RCA)  ? EYE SURGERY    ? Lasik  ? TONSILLECTOMY AND ADENOIDECTOMY  08/04/1978  ? VASECTOMY  08/04/2012  ? WISDOM TOOTH EXTRACTION    ? ? ? ?Current Outpatient Medications:  ?  allopurinol (ZYLOPRIM) 300 MG tablet, Take 1 tablet (300 mg total) by mouth daily., Disp: 90 tablet, Rfl: 1 ?  aspirin EC 81 MG tablet, Take 81 mg by mouth daily., Disp: , Rfl:  ?  hydrochlorothiazide (HYDRODIURIL) 25 MG tablet, Take 1 tablet (25 mg total) by mouth daily., Disp: 90 tablet, Rfl: 1 ?  irbesartan (AVAPRO) 300 MG tablet, Take 1 tablet (300 mg total) by mouth daily., Disp: 90 tablet, Rfl: 1 ?  pantoprazole (PROTONIX) 40 MG tablet, TAKE 1 TABLET (40 MG TOTAL) BY MOUTH DAILY. OFFICE VISIT NEEDED FOR FURTHER REFILLS, Disp: 90 tablet, Rfl: 0 ?  sildenafil (VIAGRA) 50 MG tablet, 1-2 tabs po qd prn. Take 30-60 min prior to intercourse, Disp: 10 tablet, Rfl: 5 ?  rosuvastatin (CRESTOR) 10 MG tablet, Take 1 tablet (10 mg total) by mouth daily. (Patient not taking: Reported on 12/04/2021), Disp: 30 tablet, Rfl: 2 ? ?EXAM: ? ?VITALS per patient if applicable:  ? ?  [id number]  ?  3:24 PM 11/11/2021  ?  2:09 PM 04/04/2021  ?  9:36 AM  ?Vitals with BMI  ?Height 6' 0.5" 6' 0.5" 5\' 11"   ?Weight 261 lbs 10 oz 262 lbs 10 oz 255 lbs 6 oz  ?BMI 34.97 35.11 35.64  ?Systolic 123XX123 [id number]  ?Diastolic 80 80 87  ?Pulse 76 82 93  ? ? ?GENERAL: alert, oriented, appears well and in no acute distress ? ?HEENT: atraumatic, conjunttiva clear, no obvious abnormalities on inspection of external nose and ears ? ?NECK: normal movements of the head and neck ? ?LUNGS: on inspection no signs of respiratory distress, breathing rate appears normal, no  obvious gross SOB, gasping or wheezing ? ?CV: no obvious cyanosis ? ?MS: moves all visible extremities without noticeable abnormality ? ?PSYCH/NEURO: pleasant and cooperative, no obvious depression or anxiety, speech and thought processing grossly intact ? ?LAB: none today ? ?Lab Results  ?Component Value Date  ? CHOL 215 (H) 11/25/2021  ? HDL 44.50 11/25/2021  ? LDLCALC 139 (H) 11/25/2021  ? TRIG 162.0 (H) 11/25/2021  ? CHOLHDL 5 11/25/2021  ? ?  Chemistry   ?   ?Component Value Date/Time  ? NA 140 11/25/2021 0950  ? NA 139 03/15/2015 0000  ? K 3.7 11/25/2021 0950  ? CL 101 11/25/2021 0950  ? CO2 30 11/25/2021 0950  ? BUN 11 11/25/2021 0950  ? BUN 15 03/15/2015 0000  ? CREATININE 1.09 11/25/2021 0950  ? CREATININE 1.10 01/02/2021 1605  ? GLU 87 03/15/2015 0000  ?    ?Component Value Date/Time  ? CALCIUM 9.3 11/25/2021 0950  ? ALKPHOS 56 11/25/2021 0950  ? AST 40 (H) 11/25/2021 0950  ? ALT 74 (H) 11/25/2021 0950  ? BILITOT 1.1 11/25/2021 0950  ?  ? ?ASSESSMENT AND PLAN: ? ?Discussed the following assessment and plan: ? ?#1 elevated coronary calcium score-->99th percentile.Marland Kitchen   ?Recent LDL 139. ? ?Recommended starting rosuvastatin 10 mg a day and patient is agreeable to this plan.  He also would like a cardiology referral so I have ordered this today. ?Additionally, he has had some mild blood sugar elevations on testing in the past.  I have not done a hemoglobin A1c on him in the past so I have ordered this to be done as a future test and he can make appointment to do this at his convenience. ?Plan for recheck lipid panel and AST and ALT in 2 to 3 months. ? ?I discussed the assessment and treatment plan with the patient. The patient was provided an opportunity to ask questions and all were answered. The patient agreed with the plan and demonstrated an understanding of the instructions. ?  ?F/u: 6 mo ? ?Signed:  Crissie Sickles, MD           12/04/2021 ? ?

## 2021-12-08 NOTE — Progress Notes (Signed)
?Cardiology Office Note:   ? ?Date:  12/09/2021  ? ?ID:  Hunter Wiley, DOB 05-14-1976, MRN [id number] ? ?PCP:  Tammi Sou, MD  ? ?[facility] Prov[facility] ?Cardiologist:  Lenna Sciara, MD ?Referring MD: Tammi Sou, MD  ? ?Chief Complaint/Reason for Referral: Elevated calcium score ? ?ASSESSMENT:   ? ?1. Coronary artery calcification seen on CAT scan   ?2. Hyperlipidemia, unspecified hyperlipidemia type   ?3. Hypertension, unspecified type   ? ? ?PLAN:   ? ?In order of problems listed above: ?1.  Coronary artery calcium: Continue aspirin 81 mg daily and rosuvastatin with goal LDL of less than 70.  Follow up in 1 year. ?2.  Hyperlipidemia: Will increase Crestor to 20 mg daily.  The patient's LDL recently was 139.  Goal LDL is less than 70 and as low as possible.  We will check lipid panel, LFTs, and LP(a) in 2 months. ?3.  Hypertension.  Continue current medical therapy.  Goal blood pressures less than 130/80 mmHg.  Blood pressures well controlled today. ? ? ?     ? ?   ? ?Dispo:  Return in about 1 year (around 12/10/2022).  ? ?  ? ?Medication Adjustments/Labs and Tests Ordered: ?Current medicines are reviewed at length with the patient today.  Concerns regarding medicines are outlined above. ? ?The following changes have been made:    ? ?Labs/tests ordered: ?No orders of the defined types were placed in this encounter. ? ? ?Medication Changes: ?No orders of the defined types were placed in this encounter. ? ?Current medicines are reviewed at length with the patient today.  The patient does not have concerns regarding medicines. ? ? ?History of Present Illness:   ? ?FOCUSED PROBLEM LIST:   ?1.  Coronary artery calcium on CT 2023 ?2.  Hyperlipidemia ?3.  Hepatic steatosis ?4.  Hypertension ?5.  Gout ? ?The patient is a 46 y.o. male with the indicated medical history here for for recommendations regarding coronary artery calcium.  The patient was seen by his primary care provider recently.  He had a calcium  score which was elevated.  He is referred for further recommendations.   ? ?The patient is doing well.  He denies any exertional angina.  He plays golf on a regular basis but uses a cart for most of his travel in the course.  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  He denies any exertional dyspnea.  He has had no presyncope, syncope, palpitations, paroxysmal nocturnal dyspnea, orthopnea.  He does not smoke.  He occasionally uses marijuana in the form of edibles.  He is cutting back on this in efforts to help lose weight.  He is otherwise well without significant complaints. ? ?   ?  ? ? ?Current Medications: ?Current Meds  ?Medication Sig  ? allopurinol (ZYLOPRIM) 300 MG tablet Take 1 tablet (300 mg total) by mouth daily.  ? aspirin EC 81 MG tablet Take 81 mg by mouth daily.  ? hydrochlorothiazide (HYDRODIURIL) 25 MG tablet Take 1 tablet (25 mg total) by mouth daily.  ? irbesartan (AVAPRO) 300 MG tablet Take 1 tablet (300 mg total) by mouth daily.  ? pantoprazole (PROTONIX) 40 MG tablet TAKE 1 TABLET (40 MG TOTAL) BY MOUTH DAILY. OFFICE VISIT NEEDED FOR FURTHER REFILLS  ? rosuvastatin (CRESTOR) 10 MG tablet Take 1 tablet (10 mg total) by mouth daily.  ?  ? ?Allergies:    ?Lotrel [amlodipine  besy-benazepril hcl]  ? ?Social History:   ?Social History  ? ?Tobacco Use  ? Smoking status: Never  ? Smokeless tobacco: Never  ?Vaping Use  ? Vaping Use: Never used  ?Substance Use Topics  ? Alcohol use: No  ? Drug use: No  ?  ? ?Family Hx: ?Family History  ?Problem Relation Age of Onset  ? Hypertension Mother   ? Diabetes Mother   ? Heart attack Mother   ?     Normal coronaries @ cath  ? Hypertension Father   ? Lung cancer Maternal Grandfather   ?     mesothelioma  ? Heart attack Paternal Grandfather   ?     in 70s  ?  ? ?Review of Systems:   ?Please see the history of present illness.    ?All other systems reviewed and are negative. ?  ? ? ?EKGs/Labs/Other Test Reviewed:   ? ?EKG:    EKG performed today that I personally reviewed demonstrates sinus rhythm. ? ?Prior CV studies: ? ?Calcium score 2023 demonstrates coronary artery calcium of the LAD and right coronary artery ? ?Other studies Reviewed: ?Review of the additional studies/records demonstrates: CT abdomen pelvis 2011 demonstrates no aortic atherosclerosis or aneurysm ? ?Recent Labs: ?11/25/2021: ALT 74; BUN 11; Creatinine, Ser 1.09; Hemoglobin 15.5; Platelets 177.0; Potassium 3.7; Sodium 140; TSH 3.00  ? ?Recent Lipid Panel ?Lab Results  ?Component Value Date/Time  ? CHOL 215 (H) 11/25/2021 09:50 AM  ? TRIG 162.0 (H) 11/25/2021 09:50 AM  ? HDL 44.50 11/25/2021 09:50 AM  ? LDLCALC 139 (H) 11/25/2021 09:50 AM  ? ? ?Risk Assessment/Calculations:   ? ?  ?    ? ?Physical Exam:   ? ?VS:  BP 126/76   Pulse 77   Ht 6' 0.05" (1.83 m)   Wt 255 lb 12.8 oz (116 kg)   SpO2 96%   BMI 34.64 kg/m?    ?Wt Readings from Last 3 Encounters:  ?12/09/21 255 lb 12.8 oz (116 kg)  ?12/04/21 261 lb 9.6 oz (118.7 kg)  ?11/11/21 262 lb 9.6 oz (119.1 kg)  ?  ?GENERAL:  No apparent distress, AOx3 ?HEENT:  No carotid bruits, +2 carotid impulses, no scleral icterus ?CAR: RRR no murmurs, gallops, rubs, or thrills ?RES:  Clear to auscultation bilaterally ?ABD:  Soft, nontender, nondistended, positive bowel sounds x 4 ?VASC:  +2 radial pulses, +2 carotid pulses, palpable pedal pulses ?NEURO:  CN 2-12 grossly intact; motor and sensory grossly intact ?PSYCH:  No active depression or anxiety ?EXT:  No edema, ecchymosis, or cyanosis ? ?Signed, ?Early Osmond, MD  ?12/09/2021 12:05 PM    ?[facility] ?[address], [place], [place]  [address] ?Phone: [phone]; Fax: [phone]  ? ?Note:  This document was prepared using Dragon voice recognition software and may include unintentional dictation errors. ?

## 2021-12-09 ENCOUNTER — Ambulatory Visit: Payer: BC Managed Care – PPO | Admitting: Internal Medicine

## 2021-12-09 ENCOUNTER — Encounter: Payer: Self-pay | Admitting: Internal Medicine

## 2021-12-09 VITALS — BP 126/76 | HR 77 | Ht 72.05 in | Wt 255.8 lb

## 2021-12-09 DIAGNOSIS — I2510 Atherosclerotic heart disease of native coronary artery without angina pectoris: Secondary | ICD-10-CM | POA: Diagnosis not present

## 2021-12-09 DIAGNOSIS — E785 Hyperlipidemia, unspecified: Secondary | ICD-10-CM

## 2021-12-09 DIAGNOSIS — I10 Essential (primary) hypertension: Secondary | ICD-10-CM

## 2021-12-09 MED ORDER — ROSUVASTATIN CALCIUM 20 MG PO TABS: 20.0000 mg | ORAL_TABLET | Freq: Every day | ORAL | 3 refills | Status: DC

## 2021-12-09 NOTE — Patient Instructions (Addendum)
Medication Instructions:  ?Your physician has recommended you make the following change in your medication:  ?1.) increase rosuvastatin (Crestor) to 20 mg daily ? ?*If you need a refill on your cardiac medications before your next appointment, please call your pharmacy* ? ? ?Lab Work: ?In 2 months, please return for lab work (lipids/liver/Lp(a) ? ?If you have labs (blood work) drawn today and your tests are completely normal, you will receive your results only by: ?MyChart Message (if you have MyChart) OR ?A paper copy in the mail ?If you have any lab test that is abnormal or we need to change your treatment, we will call you to review the results. ? ? ?Testing/Procedures: ?none ? ? ?Follow-Up: ?At [facility], you and your health needs are our priority.  As part of our continuing mission to provide you with exceptional heart care, we have created designated Provider Care Teams.  These Care Teams include your primary Cardiologist (physician) and Advanced Practice Providers (APPs -  Physician Assistants and Nurse Practitioners) who all work together to provide you with the care you need, when you need it. ? ?Your next appointment:   ?12 month(s) ? ?The format for your next appointment:   ?In Person ? ?Provider:   ?Alverda Skeans, MD ? ? ?Important Information About Sugar ? ? ? ? ?  ?

## 2022-01-10 ENCOUNTER — Other Ambulatory Visit: Payer: Self-pay | Admitting: Family Medicine

## 2022-01-10 ENCOUNTER — Other Ambulatory Visit: Payer: BC Managed Care – PPO | Admitting: Family Medicine

## 2022-01-10 NOTE — Telephone Encounter (Signed)
Please advise if change approved

## 2022-01-10 NOTE — Telephone Encounter (Signed)
PA started [employer] (Key: [id number])

## 2022-02-10 ENCOUNTER — Ambulatory Visit (INDEPENDENT_AMBULATORY_CARE_PROVIDER_SITE_OTHER): Payer: BC Managed Care – PPO

## 2022-02-10 ENCOUNTER — Other Ambulatory Visit: Payer: BC Managed Care – PPO

## 2022-02-10 DIAGNOSIS — E7800 Pure hypercholesterolemia, unspecified: Secondary | ICD-10-CM | POA: Diagnosis not present

## 2022-02-10 DIAGNOSIS — R7301 Impaired fasting glucose: Secondary | ICD-10-CM | POA: Diagnosis not present

## 2022-02-10 DIAGNOSIS — E785 Hyperlipidemia, unspecified: Secondary | ICD-10-CM

## 2022-02-10 DIAGNOSIS — I2510 Atherosclerotic heart disease of native coronary artery without angina pectoris: Secondary | ICD-10-CM

## 2022-02-10 LAB — LIPID PANEL
Cholesterol: 136 mg/dL (ref 0–200)
HDL: 47.7 mg/dL (ref >39.00)
LDL Cholesterol: 65 mg/dL (ref 0–99)
NonHDL: 88.34
Total CHOL/HDL Ratio: 3
Triglycerides: 115 mg/dL (ref 0.0–149.0)
VLDL: 23 mg/dL (ref 0.0–40.0)

## 2022-02-10 LAB — AST: AST: 28 U/L (ref 0–37)

## 2022-02-10 LAB — ALT: ALT: 42 U/L (ref 0–53)

## 2022-02-10 LAB — HEMOGLOBIN A1C: Hgb A1c MFr Bld: 5.8 % (ref 4.6–6.5)

## 2022-02-11 LAB — HEPATIC FUNCTION PANEL
ALT: 41 IU/L (ref 0–44)
AST: 28 IU/L (ref 0–40)
Albumin: 4.4 g/dL (ref 4.1–5.1)
Alkaline Phosphatase: 59 IU/L (ref 44–121)
Bilirubin Total: 0.7 mg/dL (ref 0.0–1.2)
Bilirubin, Direct: 0.21 mg/dL (ref 0.00–0.40)
Total Protein: 6.9 g/dL (ref 6.0–8.5)

## 2022-02-11 LAB — LIPOPROTEIN A (LPA): Lipoprotein (a): <8.4 nmol/L (ref <75.0)

## 2022-04-21 ENCOUNTER — Other Ambulatory Visit: Payer: Self-pay | Admitting: Family Medicine

## 2022-05-13 ENCOUNTER — Encounter: Payer: Self-pay | Admitting: Family Medicine

## 2022-05-13 ENCOUNTER — Ambulatory Visit: Payer: BC Managed Care – PPO | Admitting: Family Medicine

## 2022-05-13 VITALS — BP 111/75 | HR 82 | Temp 97.9°F | Ht 72.5 in | Wt 264.0 lb

## 2022-05-13 DIAGNOSIS — I10 Essential (primary) hypertension: Secondary | ICD-10-CM

## 2022-05-13 DIAGNOSIS — Z1211 Encounter for screening for malignant neoplasm of colon: Secondary | ICD-10-CM | POA: Diagnosis not present

## 2022-05-13 DIAGNOSIS — E7800 Pure hypercholesterolemia, unspecified: Secondary | ICD-10-CM | POA: Diagnosis not present

## 2022-05-13 DIAGNOSIS — Z23 Encounter for immunization: Secondary | ICD-10-CM | POA: Diagnosis not present

## 2022-05-13 DIAGNOSIS — N529 Male erectile dysfunction, unspecified: Secondary | ICD-10-CM | POA: Diagnosis not present

## 2022-05-13 DIAGNOSIS — K219 Gastro-esophageal reflux disease without esophagitis: Secondary | ICD-10-CM

## 2022-05-13 LAB — COMPREHENSIVE METABOLIC PANEL
ALT: 46 U/L (ref 0–53)
AST: 34 U/L (ref 0–37)
Albumin: 4.4 g/dL (ref 3.5–5.2)
Alkaline Phosphatase: 51 U/L (ref 39–117)
BUN: 14 mg/dL (ref 6–23)
CO2: 30 mEq/L (ref 19–32)
Calcium: 9.7 mg/dL (ref 8.4–10.5)
Chloride: 100 mEq/L (ref 96–112)
Creatinine, Ser: 1.05 mg/dL (ref 0.40–1.50)
GFR: 84.12 mL/min (ref >60.00)
Glucose, Bld: 106 mg/dL — ABNORMAL HIGH (ref 70–99)
Potassium: 3.9 mEq/L (ref 3.5–5.1)
Sodium: 138 mEq/L (ref 135–145)
Total Bilirubin: 1.3 mg/dL — ABNORMAL HIGH (ref 0.2–1.2)
Total Protein: 7.1 g/dL (ref 6.0–8.3)

## 2022-05-13 LAB — LIPID PANEL
Cholesterol: 133 mg/dL (ref 0–200)
HDL: 41.4 mg/dL (ref >39.00)
LDL Cholesterol: 69 mg/dL (ref 0–99)
NonHDL: 91.84
Total CHOL/HDL Ratio: 3
Triglycerides: 113 mg/dL (ref 0.0–149.0)
VLDL: 22.6 mg/dL (ref 0.0–40.0)

## 2022-05-13 MED ORDER — TADALAFIL 10 MG PO TABS: ORAL_TABLET | ORAL | 6 refills | Status: DC

## 2022-05-13 MED ORDER — IRBESARTAN 300 MG PO TABS
300.0000 mg | ORAL_TABLET | Freq: Every day | ORAL | 1 refills | Status: DC
Start: 2022-05-13 — End: 2022-11-05

## 2022-05-13 MED ORDER — HYDROCHLOROTHIAZIDE 25 MG PO TABS
25.0000 mg | ORAL_TABLET | Freq: Every day | ORAL | 1 refills | Status: DC
Start: 2022-05-13 — End: 2023-01-02

## 2022-05-13 MED ORDER — PANTOPRAZOLE SODIUM 40 MG PO TBEC: 40.0000 mg | DELAYED_RELEASE_TABLET | Freq: Every day | ORAL | 1 refills | Status: DC

## 2022-05-13 MED ORDER — ALLOPURINOL 300 MG PO TABS
300.0000 mg | ORAL_TABLET | Freq: Every day | ORAL | 1 refills | Status: DC
Start: 2022-05-13 — End: 2022-10-20

## 2022-05-13 NOTE — Progress Notes (Signed)
OFFICE VISIT  05/13/2022  CC:  Chief Complaint  Patient presents with   Hypertension   Hyperlipidemia    Pt is fasting    Patient is a 46 y.o. male who presents for f/u HTN, HLD, erectile dysfunction and GERD.Marland Kitchen  INTERIM HX: Hunter Wiley feels good. No home blood pressure monitoring.  LDL went down to goal after cardiologist increased his rosuvastatin to 20 mg daily.  We tried to get him on ED medication last time I saw him but there were insurance coverage denials for sildenafil 50 mg and Levitra 10 mg.  He has tried weaning himself off the pantoprazole in the past and he had immediate return of his severe reflux.  ROS as above, plus--> no fevers, no CP, no SOB, no wheezing, no cough, no dizziness, no HAs, no rashes, no melena/hematochezia.  No polyuria or polydipsia.  No myalgias or arthralgias.  No focal weakness, paresthesias, or tremors.  No acute vision or hearing abnormalities.  No dysuria or unusual/new urinary urgency or frequency.  No recent changes in lower legs. No n/v/d or abd pain.  No palpitations.    Past Medical History:  Diagnosis Date   Diarrhea 03/2015   GI eval (Dr. Dulce Sellar, [facility]) ?malabsorptive syndrome?  Work-up pending   Dislocation, coccyx closed    [facility]   Elevated coronary artery calcium score 12/03/2021   99th %'tile->rosuvastatin started   Elevated transaminase level 2017, 2019   Very mild ALT elev: Hep serologies neg. hepatic steatosis noted on CT cardiac calcium score 12/2021.   Family history of colonic polyps    father   Gout    R MTP jt   Hepatic steatosis    History of Salmonella gastroenteritis 04/2021   HTN (hypertension)    Nephrolithiasis    Nonexertional chest pain    Ruled out in ED 09/2018.  Isolated occurence.   Vasomotor rhinitis     Past Surgical History:  Procedure Laterality Date   Coronary calcium score  12/03/2021   99th %'tile (LAD and RCA)   EYE SURGERY     Lasik   TONSILLECTOMY AND ADENOIDECTOMY  08/04/1978    VASECTOMY  08/04/2012   WISDOM TOOTH EXTRACTION      Outpatient Medications Prior to Visit  Medication Sig Dispense Refill   aspirin EC 81 MG tablet Take 81 mg by mouth daily.     rosuvastatin (CRESTOR) 20 MG tablet Take 1 tablet (20 mg total) by mouth daily. 90 tablet 3   vardenafil (LEVITRA) 10 MG tablet 1-2 tabs po qd prn. Take 30-60 min prior to intercourse Strength: 50 mg 50 tablet 1   allopurinol (ZYLOPRIM) 300 MG tablet Take 1 tablet (300 mg total) by mouth daily. 90 tablet 1   hydrochlorothiazide (HYDRODIURIL) 25 MG tablet Take 1 tablet (25 mg total) by mouth daily. 90 tablet 1   irbesartan (AVAPRO) 300 MG tablet Take 1 tablet (300 mg total) by mouth daily. 90 tablet 1   pantoprazole (PROTONIX) 40 MG tablet Take 1 tablet (40 mg total) by mouth daily. 90 tablet 0   rosuvastatin (CRESTOR) 10 MG tablet TAKE 1 TABLET BY MOUTH EVERY DAY 90 tablet 0   No facility-administered medications prior to visit.    Allergies  Allergen Reactions   Lotrel [Amlodipine Besy-Benazepril Hcl] Other (See Comments) and Cough    Cough + sudden periods of severe fatigue    ROS As per HPI  PE:    05/13/2022    8:08 AM 12/09/2021  11:55 AM 12/04/2021    3:24 PM  Vitals with BMI  Height 6' 0.5" 6' 0.05" 6' 0.5"  Weight 264 lbs 255 lbs 13 oz 261 lbs 10 oz  BMI 35.29 87.56 43.32  Systolic 951 884 166  Diastolic 75 76 80  Pulse 82 77 76     Physical Exam  Gen: Alert, well appearing.  Patient is oriented to person, place, time, and situation. AFFECT: pleasant, lucid thought and speech. CV: RRR, no m/r/g.   LUNGS: CTA bilat, nonlabored resps, good aeration in all lung fields. EXT: no clubbing or cyanosis.  no edema.    LABS:  Last CBC Lab Results  Component Value Date   WBC 8.7 11/25/2021   HGB 15.5 11/25/2021   HCT 46.2 11/25/2021   MCV 87.9 11/25/2021   MCH 29.1 03/28/2020   RDW 13.9 11/25/2021   PLT 177.0 02/01/1600   Last metabolic panel Lab Results  Component Value Date    GLUCOSE 101 (H) 11/25/2021   NA 140 11/25/2021   K 3.7 11/25/2021   CL 101 11/25/2021   CO2 30 11/25/2021   BUN 11 11/25/2021   CREATININE 1.09 11/25/2021   GFRNONAA >60 03/28/2020   CALCIUM 9.3 11/25/2021   PROT 6.9 02/10/2022   ALBUMIN 4.4 02/10/2022   BILITOT 0.7 02/10/2022   ALKPHOS 59 02/10/2022   AST 28 02/10/2022   ALT 41 02/10/2022   ANIONGAP 12 03/28/2020   Lab Results  Component Value Date   HGBA1C 5.8 02/10/2022   Lab Results  Component Value Date   CHOL 136 02/10/2022   HDL 47.70 02/10/2022   LDLCALC 65 02/10/2022   TRIG 115.0 02/10/2022   CHOLHDL 3 02/10/2022   Lab Results  Component Value Date   LABURIC 7.0 07/13/2019   IMPRESSION AND PLAN:  #1 hypertension, well controlled on HCTZ 25 mg a day and a irbesartan 300 mg a day. Electrolytes and creatinine today.  2.  Hypercholesterolemia.  He had great response to rosuvastatin 20 mg a day. Checking lipid panel and hepatic panel today.  3.  Erectile dysfunction.  Insurance denied sildenafil 50 mg tabs and Levitra 10 mg tabs. We will do trial of Cialis 10 mg to, prescription printed today for him to see if he can find a specific pharmacy with good pricing. If necessary in the future we can try a prescription for sildenafil 20 mg tabs.  4. GERD.  Good response to Protonix 40 mg a day. New prescription today.  5.  Colon cancer screening. Referral to GI today.  An After Visit Summary was printed and given to the patient.  FOLLOW UP: Return in about 6 months (around 11/12/2022) for annual CPE (fasting). Next cpe 11/2022 Signed:  Crissie Sickles, MD           05/13/2022

## 2022-05-15 ENCOUNTER — Encounter: Payer: Self-pay | Admitting: Internal Medicine

## 2022-06-04 ENCOUNTER — Ambulatory Visit (AMBULATORY_SURGERY_CENTER): Payer: Self-pay

## 2022-06-04 VITALS — Ht 72.0 in | Wt 262.0 lb

## 2022-06-04 DIAGNOSIS — Z1211 Encounter for screening for malignant neoplasm of colon: Secondary | ICD-10-CM

## 2022-06-04 MED ORDER — NA SULFATE-K SULFATE-MG SULF 17.5-3.13-1.6 GM/177ML PO SOLN: 1.0000 | Freq: Once | ORAL | 0 refills | Status: AC

## 2022-06-04 NOTE — Progress Notes (Signed)

## 2022-06-17 ENCOUNTER — Encounter: Payer: Self-pay | Admitting: Internal Medicine

## 2022-06-24 ENCOUNTER — Ambulatory Visit (AMBULATORY_SURGERY_CENTER): Payer: BC Managed Care – PPO | Admitting: Internal Medicine

## 2022-06-24 ENCOUNTER — Encounter: Payer: Self-pay | Admitting: Internal Medicine

## 2022-06-24 VITALS — BP 137/90 | HR 81 | Temp 97.8°F | Resp 14 | Ht 72.5 in | Wt 262.0 lb

## 2022-06-24 DIAGNOSIS — Z1211 Encounter for screening for malignant neoplasm of colon: Secondary | ICD-10-CM | POA: Diagnosis present

## 2022-06-24 MED ORDER — SODIUM CHLORIDE 0.9 % IV SOLN: 500.0000 mL | INTRAVENOUS | Status: DC

## 2022-06-24 NOTE — Progress Notes (Signed)
GASTROENTEROLOGY PROCEDURE H&P NOTE   Primary Care Physician: Jeoffrey Massed, MD    Reason for Procedure:   Colon cancer screening  Plan:    Colonoscopy  Patient is appropriate for endoscopic procedure(s) in the ambulatory ([facility]) setting.  The nature of the procedure, as well as the risks, benefits, and alternatives were carefully and thoroughly reviewed with the patient. Ample time for discussion and questions allowed. The patient understood, was satisfied, and agreed to proceed.     HPI: Hunter Wiley is a 46 y.o. male who presents for colonoscopy for colon cancer screening. Denies blood in stools, changes in bowel habits, or unintentional weight loss. Denies family history of colon cancer.   Past Medical History:  Diagnosis Date   Diarrhea 03/2015   GI eval (Dr. Dulce Sellar, [facility]) ?malabsorptive syndrome?  Work-up pending   Dislocation, coccyx closed    [facility]   Elevated coronary artery calcium score 12/03/2021   99th %'tile->rosuvastatin started   Elevated transaminase level 2017, 2019   Very mild ALT elev: Hep serologies neg. hepatic steatosis noted on CT cardiac calcium score 12/2021.   Family history of colonic polyps    father   GERD (gastroesophageal reflux disease)    Gout    R MTP jt   Hepatic steatosis    History of Salmonella gastroenteritis 04/2021   HTN (hypertension)    Hyperlipidemia    Nephrolithiasis    Nonexertional chest pain    Ruled out in ED 09/2018.  Isolated occurence.   Vasomotor rhinitis     Past Surgical History:  Procedure Laterality Date   Coronary calcium score  12/03/2021   99th %'tile (LAD and RCA)   EYE SURGERY     Lasik   TONSILLECTOMY AND ADENOIDECTOMY  08/04/1978   VASECTOMY  08/04/2012   WISDOM TOOTH EXTRACTION      Prior to Admission medications   Medication Sig Start Date End Date Taking? Authorizing Provider  allopurinol (ZYLOPRIM) 300 MG tablet Take 1 tablet (300 mg total) by mouth daily. 05/13/22  Yes  McGowen, Maryjean Morn, MD  aspirin EC 81 MG tablet Take 81 mg by mouth daily.   Yes [provider]  hydrochlorothiazide (HYDRODIURIL) 25 MG tablet Take 1 tablet (25 mg total) by mouth daily. 05/13/22  Yes McGowen, Maryjean Morn, MD  irbesartan (AVAPRO) 300 MG tablet Take 1 tablet (300 mg total) by mouth daily. 05/13/22  Yes McGowen, Maryjean Morn, MD  pantoprazole (PROTONIX) 40 MG tablet Take 1 tablet (40 mg total) by mouth daily. 05/13/22  Yes McGowen, Maryjean Morn, MD  rosuvastatin (CRESTOR) 20 MG tablet Take 1 tablet (20 mg total) by mouth daily. 12/09/21  Yes Orbie Pyo, MD    Current Outpatient Medications  Medication Sig Dispense Refill   allopurinol (ZYLOPRIM) 300 MG tablet Take 1 tablet (300 mg total) by mouth daily. 90 tablet 1   aspirin EC 81 MG tablet Take 81 mg by mouth daily.     hydrochlorothiazide (HYDRODIURIL) 25 MG tablet Take 1 tablet (25 mg total) by mouth daily. 90 tablet 1   irbesartan (AVAPRO) 300 MG tablet Take 1 tablet (300 mg total) by mouth daily. 90 tablet 1   pantoprazole (PROTONIX) 40 MG tablet Take 1 tablet (40 mg total) by mouth daily. 90 tablet 1   rosuvastatin (CRESTOR) 20 MG tablet Take 1 tablet (20 mg total) by mouth daily. 90 tablet 3   Current Facility-Administered Medications  Medication Dose Route Frequency Provider Last Rate Last  Admin   0.9 %  sodium chloride infusion  500 mL Intravenous Continuous Sharyn Creamer, MD        Allergies as of 06/24/2022 - Review Complete 06/24/2022  Allergen Reaction Noted   Lotrel [amlodipine besy-benazepril hcl] Other (See Comments) and Cough 11/19/2015    Family History  Problem Relation Age of Onset   Hypertension Mother    Diabetes Mother    Heart attack Mother        Normal coronaries @ cath   Colon polyps Father    Hypertension Father    Lung cancer Maternal Grandfather        mesothelioma   Heart attack Paternal Grandfather        in 80s   Esophageal cancer Neg Hx    Stomach cancer Neg Hx    Rectal  cancer Neg Hx    Colon cancer Neg Hx     Social History   Socioeconomic History   Marital status: Married    Spouse name: Not on file   Number of children: Not on file   Years of education: Not on file   Highest education level: Not on file  Occupational History   Not on file  Tobacco Use   Smoking status: Never   Smokeless tobacco: Never  Vaping Use   Vaping Use: Never used  Substance and Sexual Activity   Alcohol use: No   Drug use: Yes    Types: Marijuana    Comment: edibles-last weekend   Sexual activity: Not on file  Other Topics Concern   Not on file  Social History Narrative   Married, 2 sons.   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   Lives in [place].   No T/A/Ds.   No FH of prostate or colon cancer   Social Determinants of Health   Financial Resource Strain: Not on file  Food Insecurity: Not on file  Transportation Needs: Not on file  Physical Activity: Not on file  Stress: Not on file  Social Connections: Not on file  Intimate Partner Violence: Not on file    Physical Exam: Vital signs in last 24 hours: BP 136/84   Pulse 84   Temp 97.8 F (36.6 C)   Ht 6' 0.5" (1.842 m)   Wt 262 lb (118.8 kg)   SpO2 97%   BMI 35.05 kg/m  GEN: NAD EYE: Sclerae anicteric ENT: MMM CV: Non-tachycardic Pulm: No increased work of breathing GI: Soft, NT/ND NEURO:  Alert & Oriented   Christia Reading, MD [facility]  06/24/2022 8:43 AM

## 2022-06-24 NOTE — Patient Instructions (Signed)
Handout on hemorrhoids given to patient. Resume previous diet and continue present medications. Repeat colonoscopy for surveillance in 10 years!  YOU HAD AN ENDOSCOPIC PROCEDURE TODAY AT THE [facility]:   Refer to the procedure report that was given to you for any specific questions about what was found during the examination.  If the procedure report does not answer your questions, please call your gastroenterologist to clarify.  If you requested that your care partner not be given the details of your procedure findings, then the procedure report has been included in a sealed envelope for you to review at your convenience later.  YOU SHOULD EXPECT: Some feelings of bloating in the abdomen. Passage of more gas than usual.  Walking can help get rid of the air that was put into your GI tract during the procedure and reduce the bloating. If you had a lower endoscopy (such as a colonoscopy or flexible sigmoidoscopy) you may notice spotting of blood in your stool or on the toilet paper. If you underwent a bowel prep for your procedure, you may not have a normal bowel movement for a few days.  Please Note:  You might notice some irritation and congestion in your nose or some drainage.  This is from the oxygen used during your procedure.  There is no need for concern and it should clear up in a day or so.  SYMPTOMS TO REPORT IMMEDIATELY:  Following lower endoscopy (colonoscopy or flexible sigmoidoscopy):  Excessive amounts of blood in the stool  Significant tenderness or worsening of abdominal pains  Swelling of the abdomen that is new, acute  Fever of 100F or higher  For urgent or emergent issues, a gastroenterologist can be reached at any hour by calling [phone]. Do not use MyChart messaging for urgent concerns.    DIET:  We do recommend a small meal at first, but then you may proceed to your regular diet.  Drink plenty of fluids but you should avoid alcoholic beverages for 24  hours.  ACTIVITY:  You should plan to take it easy for the rest of today and you should NOT DRIVE or use heavy machinery until tomorrow (because of the sedation medicines used during the test).    FOLLOW UP: Our staff will call the number listed on your records the next business day following your procedure.  We will call around 7:15- 8:00 am to check on you and address any questions or concerns that you may have regarding the information given to you following your procedure. If we do not reach you, we will leave a message.     If any biopsies were taken you will be contacted by phone or by letter within the next 1-3 weeks.  Please call us at [phone] if you have not heard about the biopsies in 3 weeks.    SIGNATURES/CONFIDENTIALITY: You and/or your care partner have signed paperwork which will be entered into your electronic medical record.  These signatures attest to the fact that that the information above on your After Visit Summary has been reviewed and is understood.  Full responsibility of the confidentiality of this discharge information lies with you and/or your care-partner. 

## 2022-06-24 NOTE — Progress Notes (Signed)
A and O x3. Report to RN. Tolerated MAC anesthesia well. 

## 2022-06-24 NOTE — Progress Notes (Signed)
Vital signs checked by:DT  The patient states no changes in medical or surgical history since pre-visit screening on 06/04/22.   

## 2022-06-24 NOTE — Op Note (Signed)
[facility] Patient Name: Hunter Wiley Procedure Date: 06/24/2022 8:44 AM MRN: [id number] Endoscopist: Adline Mango [place] , , [id number] Age: 46 Referring MD:  Date of Birth: 1975/09/14 Gender: Male Account #: [id number] Procedure:                Colonoscopy Indications:              Screening for colorectal malignant neoplasm, This                            is the patient's first colonoscopy Medicines:                Monitored Anesthesia Care Procedure:                Pre-Anesthesia Assessment:                           - Prior to the procedure, a History and Physical                            was performed, and patient medications and                            allergies were reviewed. The patient's tolerance of                            previous anesthesia was also reviewed. The risks                            and benefits of the procedure and the sedation                            options and risks were discussed with the patient.                            All questions were answered, and informed consent                            was obtained. Prior Anticoagulants: The patient has                            taken no anticoagulant or antiplatelet agents. ASA                            Grade Assessment: II - A patient with mild systemic                            disease. After reviewing the risks and benefits,                            the patient was deemed in satisfactory condition to                            undergo the procedure.  After obtaining informed consent, the colonoscope                            was passed under direct vision. Throughout the                            procedure, the patient's blood pressure, pulse, and                            oxygen saturations were monitored continuously. The                            CF [id number] was introduced through the anus                            and advanced to the  the terminal ileum. The                            colonoscopy was performed without difficulty. The                            patient tolerated the procedure well. The quality                            of the bowel preparation was good. The terminal                            ileum, ileocecal valve, appendiceal orifice, and                            rectum were photographed. Scope In: 8:54:26 AM Scope Out: 9:09:35 AM Total Procedure Duration: 0 hours 15 minutes 9 seconds  Findings:                 The terminal ileum appeared normal.                           Non-bleeding internal hemorrhoids were found during                            retroflexion.                           The exam was otherwise without abnormality. Complications:            No immediate complications. Estimated Blood Loss:     Estimated blood loss: none. Impression:               - The examined portion of the ileum was normal.                           - Non-bleeding internal hemorrhoids.                           - The examination was otherwise normal.                           -  No specimens collected. Recommendation:           - Discharge patient to home (with escort).                           - Repeat colonoscopy in 10 years for screening                            purposes.                           - The findings and recommendations were discussed                            with the patient. Dr Particia Lather "Alan Ripper" Leonides Schanz,  06/24/2022 9:11:27 AM

## 2022-06-25 ENCOUNTER — Telehealth: Payer: Self-pay | Admitting: *Deleted

## 2022-06-25 NOTE — Telephone Encounter (Signed)
  Follow up Call-     06/24/2022    7:48 AM  Call back number  Post procedure Call Back phone  # [phone]  Permission to leave phone message Yes     Patient questions:  Do you have a fever, pain , or abdominal swelling? No. Pain Score  0 *  Have you tolerated food without any problems? Yes.    Have you been able to return to your normal activities? Yes.    Do you have any questions about your discharge instructions: Diet   No. Medications  No. Follow up visit  No.  Do you have questions or concerns about your Care? No.  Actions: * If pain score is 4 or above: No action needed, pain <4.

## 2022-08-18 ENCOUNTER — Encounter: Payer: Self-pay | Admitting: Family Medicine

## 2022-08-18 ENCOUNTER — Ambulatory Visit: Payer: BC Managed Care – PPO | Admitting: Family Medicine

## 2022-08-18 VITALS — BP 125/75 | HR 70 | Temp 97.7°F | Ht 72.5 in | Wt 271.6 lb

## 2022-08-18 DIAGNOSIS — E669 Obesity, unspecified: Secondary | ICD-10-CM

## 2022-08-18 DIAGNOSIS — Z7689 Persons encountering health services in other specified circumstances: Secondary | ICD-10-CM

## 2022-08-18 MED ORDER — TIRZEPATIDE-WEIGHT MANAGEMENT 2.5 MG/0.5ML ~~LOC~~ SOAJ: 2.5000 mg | SUBCUTANEOUS | 0 refills | Status: DC

## 2022-08-18 MED ORDER — SILDENAFIL CITRATE 50 MG PO TABS: ORAL_TABLET | ORAL | 5 refills | Status: DC

## 2022-08-18 NOTE — Progress Notes (Signed)
OFFICE VISIT  08/18/2022  CC:  Chief Complaint  Patient presents with   Weight Loss    Pt would like to discuss weight loss management options. Pt has improved diet, ideal weight 200lbs    Patient is a 47 y.o. male who presents for question of weight management.  HPI: Hunter Wiley is working hard with eating a low calorie diet and exercising daily. He is interested in assistance with a GLP-1 receptor agonist. He is check with his insurer and says Wegovy and Zepbound are covered.  Additionally, he has had trouble getting sildenafil approved by his insurer.  Not sure what the problem is, pharmacist told him maybe the number of pills to be dispensed.  I prescribed #10 at the last prescription of 50 mg sildenafil. She has never been dispensed any ED medication because of insurance problems.   Past Medical History:  Diagnosis Date   Diarrhea 03/2015   GI eval (Dr. Paulita Fujita, [facility]) ?malabsorptive syndrome?  Work-up pending   Dislocation, coccyx closed    [facility]   Elevated coronary artery calcium score 12/03/2021   99th %'tile->rosuvastatin started   Elevated transaminase level 2017, 2019   Very mild ALT elev: Hep serologies neg. hepatic steatosis noted on CT cardiac calcium score 12/2021.   Family history of colonic polyps    father   GERD (gastroesophageal reflux disease)    Gout    R MTP jt   Hepatic steatosis    History of Salmonella gastroenteritis 04/2021   HTN (hypertension)    Hyperlipidemia    Nephrolithiasis    Nonexertional chest pain    Ruled out in ED 09/2018.  Isolated occurence.   Vasomotor rhinitis     Past Surgical History:  Procedure Laterality Date   Coronary calcium score  12/03/2021   99th %'tile (LAD and RCA)   EYE SURGERY     Lasik   TONSILLECTOMY AND ADENOIDECTOMY  08/04/1978   VASECTOMY  08/04/2012   WISDOM TOOTH EXTRACTION      Outpatient Medications Prior to Visit  Medication Sig Dispense Refill   allopurinol (ZYLOPRIM) 300 MG tablet Take 1  tablet (300 mg total) by mouth daily. 90 tablet 1   aspirin EC 81 MG tablet Take 81 mg by mouth daily.     hydrochlorothiazide (HYDRODIURIL) 25 MG tablet Take 1 tablet (25 mg total) by mouth daily. 90 tablet 1   irbesartan (AVAPRO) 300 MG tablet Take 1 tablet (300 mg total) by mouth daily. 90 tablet 1   pantoprazole (PROTONIX) 40 MG tablet Take 1 tablet (40 mg total) by mouth daily. 90 tablet 1   rosuvastatin (CRESTOR) 20 MG tablet Take 1 tablet (20 mg total) by mouth daily. 90 tablet 3   No facility-administered medications prior to visit.    Allergies  Allergen Reactions   Lotrel [Amlodipine Besy-Benazepril Hcl] Other (See Comments) and Cough    Cough + sudden periods of severe fatigue    Review of Systems  As per HPI  PE:    08/18/2022   10:48 AM 06/24/2022    9:32 AM 06/24/2022    9:22 AM  Vitals with BMI  Height 6' 0.5"    Weight 271 lbs 10 oz    BMI 40.34    Systolic 742 595 638  Diastolic 75 90 80  Pulse 70 81 71     Physical Exam  Gen: Alert, well appearing.  Patient is oriented to person, place, time, and situation. AFFECT: pleasant, lucid thought and speech. No  further exam today  LABS:  Last CBC Lab Results  Component Value Date   WBC 8.7 11/25/2021   HGB 15.5 11/25/2021   HCT 46.2 11/25/2021   MCV 87.9 11/25/2021   MCH 29.1 03/28/2020   RDW 13.9 11/25/2021   PLT 177.0 30/02/6225   Last metabolic panel Lab Results  Component Value Date   GLUCOSE 106 (H) 05/13/2022   NA 138 05/13/2022   K 3.9 05/13/2022   CL 100 05/13/2022   CO2 30 05/13/2022   BUN 14 05/13/2022   CREATININE 1.05 05/13/2022   GFRNONAA >60 03/28/2020   CALCIUM 9.7 05/13/2022   PROT 7.1 05/13/2022   ALBUMIN 4.4 05/13/2022   BILITOT 1.3 (H) 05/13/2022   ALKPHOS 51 05/13/2022   AST 34 05/13/2022   ALT 46 05/13/2022   ANIONGAP 12 03/28/2020   Last lipids Lab Results  Component Value Date   CHOL 133 05/13/2022   HDL 41.40 05/13/2022   LDLCALC 69 05/13/2022   TRIG 113.0  05/13/2022   CHOLHDL 3 05/13/2022   Last hemoglobin A1c Lab Results  Component Value Date   HGBA1C 5.8 02/10/2022   Last thyroid functions Lab Results  Component Value Date   TSH 3.00 11/25/2021   IMPRESSION AND PLAN:  #1 class II obesity, encounter for weight management. He is maximizing dietary and exercise efforts. Will start tirzepatide (Zepbound) 2.5 mg subcu weekly. Therapeutic expectations and side effect profile of medication discussed today.  Patient's questions answered.  #2 erectile dysfunction, difficulties getting prescriptions approved/filled. Will try Viagra 50 mg, 1-2 daily as needed, #5, refill x 5. Hopefully this dispense number will satisfy his insurer.  An After Visit Summary was printed and given to the patient.  FOLLOW UP: Return in about 4 weeks (around 09/15/2022) for f/u wt mgmt. Next CPE 11/12/22 Signed:  Crissie Sickles, MD           08/18/2022

## 2022-09-16 ENCOUNTER — Ambulatory Visit (INDEPENDENT_AMBULATORY_CARE_PROVIDER_SITE_OTHER): Payer: BC Managed Care – PPO | Admitting: Family Medicine

## 2022-09-16 ENCOUNTER — Encounter: Payer: Self-pay | Admitting: Family Medicine

## 2022-09-16 VITALS — BP 129/82 | HR 46 | Temp 97.9°F | Ht 72.5 in | Wt 270.6 lb

## 2022-09-16 DIAGNOSIS — E669 Obesity, unspecified: Secondary | ICD-10-CM | POA: Diagnosis not present

## 2022-09-16 DIAGNOSIS — Z7689 Persons encountering health services in other specified circumstances: Secondary | ICD-10-CM | POA: Diagnosis not present

## 2022-09-16 MED ORDER — SAXENDA 18 MG/3ML ~~LOC~~ SOPN: 0.6000 mg | PEN_INJECTOR | Freq: Every day | SUBCUTANEOUS | 0 refills | Status: DC

## 2022-09-16 NOTE — Progress Notes (Signed)
OFFICE VISIT  09/16/2022  CC:  Chief Complaint  Patient presents with   Weight management    Patient is a 47 y.o. male who presents for 1 month follow-up weight management A/P as of last visit: "1 class II obesity, encounter for weight management. He is maximizing dietary and exercise efforts. Will start tirzepatide (Zepbound) 2.5 mg subcu weekly. Therapeutic expectations and side effect profile of medication discussed today.  Patient's questions answered.   #2 erectile dysfunction, difficulties getting prescriptions approved/filled. Will try Viagra 50 mg, 1-2 daily as needed, #5, refill x 5. Hopefully this dispense number will satisfy his insurer."  INTERIM HX: Mounjaro not cost effective. He was able to get the sildenafil.  No new concerns.   Past Medical History:  Diagnosis Date   Diarrhea 03/2015   GI eval (Dr. Paulita Fujita, [facility]) ?malabsorptive syndrome?  Work-up pending   Dislocation, coccyx closed    [facility]   Elevated coronary artery calcium score 12/03/2021   99th %'tile->rosuvastatin started   Elevated transaminase level 2017, 2019   Very mild ALT elev: Hep serologies neg. hepatic steatosis noted on CT cardiac calcium score 12/2021.   GERD (gastroesophageal reflux disease)    Gout    R MTP jt   Hepatic steatosis    History of Salmonella gastroenteritis 04/2021   HTN (hypertension)    Hyperlipidemia    Nephrolithiasis    Nonexertional chest pain    Ruled out in ED 09/2018.  Isolated occurence.   Obesity, Class II, BMI 35-39.9    Vasomotor rhinitis     Past Surgical History:  Procedure Laterality Date   COLONOSCOPY     06/24/22 NORMAL. Recall 69yr  Coronary calcium score  12/03/2021   99th %'tile (LAD and RCA)   EYE SURGERY     Lasik   TONSILLECTOMY AND ADENOIDECTOMY  08/04/1978   VASECTOMY  08/04/2012   WISDOM TOOTH EXTRACTION      Outpatient Medications Prior to Visit  Medication Sig Dispense Refill   allopurinol (ZYLOPRIM) 300 MG tablet Take  1 tablet (300 mg total) by mouth daily. 90 tablet 1   aspirin EC 81 MG tablet Take 81 mg by mouth daily.     hydrochlorothiazide (HYDRODIURIL) 25 MG tablet Take 1 tablet (25 mg total) by mouth daily. 90 tablet 1   irbesartan (AVAPRO) 300 MG tablet Take 1 tablet (300 mg total) by mouth daily. 90 tablet 1   pantoprazole (PROTONIX) 40 MG tablet Take 1 tablet (40 mg total) by mouth daily. 90 tablet 1   rosuvastatin (CRESTOR) 20 MG tablet Take 1 tablet (20 mg total) by mouth daily. 90 tablet 3   sildenafil (VIAGRA) 50 MG tablet 1-2 tabs po qd prn intercourse 5 tablet 5   tirzepatide (ZEPBOUND) 2.5 MG/0.5ML Pen Inject 2.5 mg into the skin once a week. (Patient not taking: Reported on 09/16/2022) 2 mL 0   No facility-administered medications prior to visit.    Allergies  Allergen Reactions   Lotrel [Amlodipine Besy-Benazepril Hcl] Other (See Comments) and Cough    Cough + sudden periods of severe fatigue   Review of Systems As per HPI  PE:    09/16/2022   10:19 AM 08/18/2022   10:48 AM 06/24/2022    9:32 AM  Vitals with BMI  Height 6' 0.5" 6' 0.5"   Weight 270 lbs 10 oz 271 lbs 10 oz   BMI [id number]  Systolic [id number] Diastolic 82 75 90  Pulse 46  70 81     Physical Exam  Gen: Alert, well appearing.  Patient is oriented to person, place, time, and situation. AFFECT: pleasant, lucid thought and speech. No further exam today.  LABS:  Last CBC Lab Results  Component Value Date   WBC 8.7 11/25/2021   HGB 15.5 11/25/2021   HCT 46.2 11/25/2021   MCV 87.9 11/25/2021   MCH 29.1 03/28/2020   RDW 13.9 11/25/2021   PLT 177.0 99991111   Last metabolic panel Lab Results  Component Value Date   GLUCOSE 106 (H) 05/13/2022   NA 138 05/13/2022   K 3.9 05/13/2022   CL 100 05/13/2022   CO2 30 05/13/2022   BUN 14 05/13/2022   CREATININE 1.05 05/13/2022   GFRNONAA >60 03/28/2020   CALCIUM 9.7 05/13/2022   PROT 7.1 05/13/2022   ALBUMIN 4.4 05/13/2022   BILITOT 1.3 (H)  05/13/2022   ALKPHOS 51 05/13/2022   AST 34 05/13/2022   ALT 46 05/13/2022   ANIONGAP 12 03/28/2020   Last hemoglobin A1c Lab Results  Component Value Date   HGBA1C 5.8 02/10/2022    IMPRESSION AND PLAN:  Obesity class 2, encounter for wt mgmt. Mounjaro too costly. Will see if insurer covers saxenda any better: 0.24m SQ q day. If he gets this and takes it then we'll f/u in 163moOtherwise, he'll f/u prn.  An After Visit Summary was printed and given to the patient.  FOLLOW UP: No follow-ups on file.  Signed:  PhCrissie SicklesMD           09/16/2022

## 2022-09-16 NOTE — Addendum Note (Signed)
Addended by: Tammi Sou on: 09/16/2022 10:50 AM   Modules accepted: Level of Service

## 2022-10-20 ENCOUNTER — Other Ambulatory Visit: Payer: Self-pay | Admitting: Family Medicine

## 2022-11-05 ENCOUNTER — Other Ambulatory Visit: Payer: Self-pay | Admitting: Family Medicine

## 2022-11-12 ENCOUNTER — Encounter: Payer: BC Managed Care – PPO | Admitting: Family Medicine

## 2022-11-25 ENCOUNTER — Encounter: Payer: BC Managed Care – PPO | Admitting: Family Medicine

## 2022-11-26 NOTE — Patient Instructions (Addendum)
Ask pharmacy about Hunter Wiley   It was very nice to see you today!   PLEASE NOTE:   If you had any lab tests please let us know if you have not heard back within a few days. You may see your results on MyChart before we have a chance to review them but we will give you a call once they are reviewed by [place]. If we ordered any referrals today, please let us know if you have not heard from their office within the next 2 weeks. You should receive a letter via MyChart confirming if the referral was approved and their office contact information to schedule.  Health Maintenance, Male Adopting a healthy lifestyle and getting preventive care are important in promoting health and wellness. Ask your health care provider about: The right schedule for you to have regular tests and exams. Things you can do on your own to prevent diseases and keep yourself healthy. What should I know about diet, weight, and exercise? Eat a healthy diet  Eat a diet that includes plenty of vegetables, fruits, low-fat dairy products, and lean protein. Do not eat a lot of foods that are high in solid fats, added sugars, or sodium. Maintain a healthy weight Body mass index (BMI) is a measurement that can be used to identify possible weight problems. It estimates body fat based on height and weight. Your health care provider can help determine your BMI and help you achieve or maintain a healthy weight. Get regular exercise Get regular exercise. This is one of the most important things you can do for your health. Most adults should: Exercise for at least 150 minutes each week. The exercise should increase your heart rate and make you sweat (moderate-intensity exercise). Do strengthening exercises at least twice a week. This is in addition to the moderate-intensity exercise. Spend less time sitting. Even light physical activity can be beneficial. Watch cholesterol and blood lipids Have your blood tested for lipids and cholesterol at  49 years of age, then have this test every 5 years. You may need to have your cholesterol levels checked more often if: Your lipid or cholesterol levels are high. You are older than 49 years of age. You are at high risk for heart disease. What should I know about cancer screening? Many types of cancers can be detected early and may often be prevented. Depending on your health history and family history, you may need to have cancer screening at various ages. This may include screening for: Colorectal cancer. Prostate cancer. Skin cancer. Lung cancer. What should I know about heart disease, diabetes, and high blood pressure? Blood pressure and heart disease High blood pressure causes heart disease and increases the risk of stroke. This is more likely to develop in people who have high blood pressure readings or are overweight. Talk with your health care provider about your target blood pressure readings. Have your blood pressure checked: Every 3-5 years if you are 17-49 years of age. Every year if you are 70 years old or older. If you are between the ages of 56 and 67 and are a current or former smoker, ask your health care provider if you should have a one-time screening for abdominal aortic aneurysm (AAA). Diabetes Have regular diabetes screenings. This checks your fasting blood sugar level. Have the screening done: Once every three years after age 59 if you are at a normal weight and have a low risk for diabetes. More often and at a younger age if you  are overweight or have a high risk for diabetes. What should I know about preventing infection? Hepatitis B If you have a higher risk for hepatitis B, you should be screened for this virus. Talk with your health care provider to find out if you are at risk for hepatitis B infection. Hepatitis C Blood testing is recommended for: Everyone born from [birth year] through 1967. Anyone with known risk factors for hepatitis C. Sexually transmitted  infections (STIs) You should be screened each year for STIs, including gonorrhea and chlamydia, if: You are sexually active and are younger than 49 years of age. You are older than 49 years of age and your health care provider tells you that you are at risk for this type of infection. Your sexual activity has changed since you were last screened, and you are at increased risk for chlamydia or gonorrhea. Ask your health care provider if you are at risk. Ask your health care provider about whether you are at high risk for HIV. Your health care provider may recommend a prescription medicine to help prevent HIV infection. If you choose to take medicine to prevent HIV, you should first get tested for HIV. You should then be tested every 3 months for as long as you are taking the medicine. Follow these instructions at home: Alcohol use Do not drink alcohol if your health care provider tells you not to drink. If you drink alcohol: Limit how much you have to 0-2 drinks a day. Know how much alcohol is in your drink. In the [place] one drink equals one 12 oz bottle of beer (355 mL), one 5 oz glass of wine (148 mL), or one 1 oz glass of hard liquor (44 mL). Lifestyle Do not use any products that contain nicotine or tobacco. These products include cigarettes, chewing tobacco, and vaping devices, such as e-cigarettes. If you need help quitting, ask your health care provider. Do not use street drugs. Do not share needles. Ask your health care provider for help if you need support or information about quitting drugs. General instructions Schedule regular health, dental, and eye exams. Stay current with your vaccines. Tell your health care provider if: You often feel depressed. You have ever been abused or do not feel safe at home. Summary Adopting a healthy lifestyle and getting preventive care are important in promoting health and wellness. Follow your health care provider's instructions about healthy  diet, exercising, and getting tested or screened for diseases. Follow your health care provider's instructions on monitoring your cholesterol and blood pressure. This information is not intended to replace advice given to you by your health care provider. Make sure you discuss any questions you have with your health care provider. Document Revised: 12/10/2020 Document Reviewed: 12/10/2020 Elsevier Patient Education  2023 [employer].

## 2022-12-03 ENCOUNTER — Ambulatory Visit (INDEPENDENT_AMBULATORY_CARE_PROVIDER_SITE_OTHER): Payer: BC Managed Care – PPO | Admitting: Family Medicine

## 2022-12-03 ENCOUNTER — Encounter: Payer: Self-pay | Admitting: Family Medicine

## 2022-12-03 VITALS — BP 116/76 | HR 76 | Temp 98.1°F | Ht 72.5 in | Wt 260.0 lb

## 2022-12-03 DIAGNOSIS — I10 Essential (primary) hypertension: Secondary | ICD-10-CM | POA: Diagnosis not present

## 2022-12-03 DIAGNOSIS — R7303 Prediabetes: Secondary | ICD-10-CM

## 2022-12-03 DIAGNOSIS — E7800 Pure hypercholesterolemia, unspecified: Secondary | ICD-10-CM

## 2022-12-03 DIAGNOSIS — Z0000 Encounter for general adult medical examination without abnormal findings: Secondary | ICD-10-CM

## 2022-12-03 LAB — CBC WITH DIFFERENTIAL/PLATELET
Basophils Absolute: 0 10*3/uL (ref 0.0–0.1)
Basophils Relative: 0.5 % (ref 0.0–3.0)
Eosinophils Absolute: 0.2 10*3/uL (ref 0.0–0.7)
Eosinophils Relative: 2.3 % (ref 0.0–5.0)
HCT: 45.4 % (ref 39.0–52.0)
Hemoglobin: 15.4 g/dL (ref 13.0–17.0)
Lymphocytes Relative: 20.2 % (ref 12.0–46.0)
Lymphs Abs: 1.4 10*3/uL (ref 0.7–4.0)
MCHC: 33.9 g/dL (ref 30.0–36.0)
MCV: 87.4 fl (ref 78.0–100.0)
Monocytes Absolute: 0.6 10*3/uL (ref 0.1–1.0)
Monocytes Relative: 8.6 % (ref 3.0–12.0)
Neutro Abs: 4.8 10*3/uL (ref 1.4–7.7)
Neutrophils Relative %: 68.4 % (ref 43.0–77.0)
Platelets: 177 10*3/uL (ref 150.0–400.0)
RBC: 5.19 Mil/uL (ref 4.22–5.81)
RDW: 13.5 % (ref 11.5–15.5)
WBC: 7.1 10*3/uL (ref 4.0–10.5)

## 2022-12-03 LAB — COMPREHENSIVE METABOLIC PANEL
ALT: 58 U/L — ABNORMAL HIGH (ref 0–53)
AST: 36 U/L (ref 0–37)
Albumin: 4.5 g/dL (ref 3.5–5.2)
Alkaline Phosphatase: 50 U/L (ref 39–117)
BUN: 16 mg/dL (ref 6–23)
CO2: 29 mEq/L (ref 19–32)
Calcium: 9.8 mg/dL (ref 8.4–10.5)
Chloride: 100 mEq/L (ref 96–112)
Creatinine, Ser: 1.02 mg/dL (ref 0.40–1.50)
GFR: 86.76 mL/min (ref >60.00)
Glucose, Bld: 90 mg/dL (ref 70–99)
Potassium: 3.8 mEq/L (ref 3.5–5.1)
Sodium: 140 mEq/L (ref 135–145)
Total Bilirubin: 1 mg/dL (ref 0.2–1.2)
Total Protein: 7.1 g/dL (ref 6.0–8.3)

## 2022-12-03 LAB — HEMOGLOBIN A1C: Hgb A1c MFr Bld: 5.8 % (ref 4.6–6.5)

## 2022-12-03 LAB — LIPID PANEL
Cholesterol: 127 mg/dL (ref 0–200)
HDL: 41.6 mg/dL (ref >39.00)
LDL Cholesterol: 66 mg/dL (ref 0–99)
NonHDL: 85.15
Total CHOL/HDL Ratio: 3
Triglycerides: 97 mg/dL (ref 0.0–149.0)
VLDL: 19.4 mg/dL (ref 0.0–40.0)

## 2022-12-03 LAB — TSH: TSH: 1.76 u[IU]/mL (ref 0.35–5.50)

## 2022-12-03 NOTE — Progress Notes (Signed)
Office Note 12/03/2022  CC:  Chief Complaint  Patient presents with   Annual Exam    Pt is fasting   Patient is a 47 y.o. male who is here for annual health maintenance exam and follow-up hypertension, hyperlipidemia, and weight management. A/P as of last visit on 09/16/22: "Obesity class 2, encounter for wt mgmt. Mounjaro too costly. Will see if insurer covers saxenda any better: 0.6mg  SQ q day. If he gets this and takes it then we'll f/u in 63mo. Otherwise, he'll f/u prn."  INTERIM HX: He is feeling well. Just got back from a golf trip at [facility].  He still has not gotten on Saxenda--states that the pharmacy has told him they cannot get it due to supply issues. He has been eating smaller portions.    Past Medical History:  Diagnosis Date   Diarrhea 03/2015   GI eval (Dr. Dulce Sellar, [facility]) ?malabsorptive syndrome?  Work-up pending   Dislocation, coccyx closed    [facility]   Elevated coronary artery calcium score 12/03/2021   99th %'tile->rosuvastatin started   Elevated transaminase level 2017, 2019   Very mild ALT elev: Hep serologies neg. hepatic steatosis noted on CT cardiac calcium score 12/2021.   GERD (gastroesophageal reflux disease)    Gout    R MTP jt   Hepatic steatosis    History of Salmonella gastroenteritis 04/2021   HTN (hypertension)    Hyperlipidemia    Nephrolithiasis    Nonexertional chest pain    Ruled out in ED 09/2018.  Isolated occurence.   Obesity, Class II, BMI 35-39.9    Vasomotor rhinitis     Past Surgical History:  Procedure Laterality Date   COLONOSCOPY     06/24/22 NORMAL. Recall 34yr   Coronary calcium score  12/03/2021   99th %'tile (LAD and RCA)   EYE SURGERY     Lasik   TONSILLECTOMY AND ADENOIDECTOMY  08/04/1978   VASECTOMY  08/04/2012   WISDOM TOOTH EXTRACTION      Family History  Problem Relation Age of Onset   Hypertension Mother    Diabetes Mother    Heart attack Mother        Normal coronaries @ cath   Colon  polyps Father    Hypertension Father    Lung cancer Maternal Grandfather        mesothelioma   Heart attack Paternal Grandfather        in 63s   Esophageal cancer Neg Hx    Stomach cancer Neg Hx    Rectal cancer Neg Hx    Colon cancer Neg Hx     Social History   Socioeconomic History   Marital status: Married    Spouse name: Not on file   Number of children: Not on file   Years of education: Not on file   Highest education level: Not on file  Occupational History   Not on file  Tobacco Use   Smoking status: Never   Smokeless tobacco: Never  Vaping Use   Vaping Use: Never used  Substance and Sexual Activity   Alcohol use: No   Drug use: Yes    Types: Marijuana    Comment: edibles-last weekend   Sexual activity: Not on file  Other Topics Concern   Not on file  Social History Narrative   Married, 2 sons.   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   Lives in [place].   No T/A/Ds.   No FH of prostate or colon  cancer   Social Determinants of Health   Financial Resource Strain: Not on file  Food Insecurity: Not on file  Transportation Needs: Not on file  Physical Activity: Not on file  Stress: Not on file  Social Connections: Not on file  Intimate Partner Violence: Not on file    Outpatient Medications Prior to Visit  Medication Sig Dispense Refill   allopurinol (ZYLOPRIM) 300 MG tablet TAKE 1 TABLET BY MOUTH EVERY DAY 30 tablet 0   aspirin EC 81 MG tablet Take 81 mg by mouth daily.     hydrochlorothiazide (HYDRODIURIL) 25 MG tablet Take 1 tablet (25 mg total) by mouth daily. 90 tablet 1   irbesartan (AVAPRO) 300 MG tablet TAKE 1 TABLET BY MOUTH EVERY DAY 90 tablet 1   Liraglutide -Weight Management (SAXENDA) 18 MG/3ML SOPN Inject 0.6 mg into the skin daily. 3 mL 0   pantoprazole (PROTONIX) 40 MG tablet Take 1 tablet (40 mg total) by mouth daily. 90 tablet 1   rosuvastatin (CRESTOR) 20 MG tablet Take 1 tablet (20 mg total) by mouth daily. 90 tablet 3   sildenafil  (VIAGRA) 50 MG tablet 1-2 tabs po qd prn intercourse 5 tablet 5   No facility-administered medications prior to visit.    Allergies  Allergen Reactions   Lotrel [Amlodipine Besy-Benazepril Hcl] Other (See Comments) and Cough    Cough + sudden periods of severe fatigue    Review of Systems  Constitutional:  Negative for appetite change, chills, fatigue and fever.  HENT:  Negative for congestion, dental problem, ear pain and sore throat.   Eyes:  Negative for discharge, redness and visual disturbance.  Respiratory:  Negative for cough, chest tightness, shortness of breath and wheezing.   Cardiovascular:  Negative for chest pain, palpitations and leg swelling.  Gastrointestinal:  Negative for abdominal pain, blood in stool, diarrhea, nausea and vomiting.  Genitourinary:  Negative for difficulty urinating, dysuria, flank pain, frequency, hematuria and urgency.  Musculoskeletal:  Negative for arthralgias, back pain, joint swelling, myalgias and neck stiffness.  Skin:  Negative for pallor and rash.  Neurological:  Negative for dizziness, speech difficulty, weakness and headaches.  Hematological:  Negative for adenopathy. Does not bruise/bleed easily.  Psychiatric/Behavioral:  Negative for confusion and sleep disturbance. The patient is not nervous/anxious.     PE;    12/03/2022    8:41 AM 09/16/2022   10:19 AM 08/18/2022   10:48 AM  Vitals with BMI  Height 6' 0.5" 6' 0.5" 6' 0.5"  Weight 260 lbs 270 lbs 10 oz 271 lbs 10 oz  BMI 34.76 36.18 36.31  Systolic 116 129 161  Diastolic 76 82 75  Pulse 76 46 70     Gen: Alert, well appearing.  Patient is oriented to person, place, time, and situation. AFFECT: pleasant, lucid thought and speech. ENT: Ears: EACs clear, normal epithelium.  TMs with good light reflex and landmarks bilaterally.  Eyes: no injection, icteris, swelling, or exudate.  EOMI, PERRLA. Nose: no drainage or turbinate edema/swelling.  No injection or focal lesion.  Mouth:  lips without lesion/swelling.  Oral mucosa pink and moist.  Dentition intact and without obvious caries or gingival swelling.  Oropharynx without erythema, exudate, or swelling.  Neck: supple/nontender.  No LAD, mass, or TM.  Carotid pulses 2+ bilaterally, without bruits. CV: RRR, no m/r/g.   LUNGS: CTA bilat, nonlabored resps, good aeration in all lung fields. ABD: soft, NT, ND, BS normal.  No hepatospenomegaly or mass.  No bruits. EXT:  no clubbing, cyanosis, or edema.  Musculoskeletal: no joint swelling, erythema, warmth, or tenderness.  ROM of all joints intact. Skin - no sores or suspicious lesions or rashes or color changes  Pertinent labs:  Lab Results  Component Value Date   TSH 3.00 11/25/2021   Lab Results  Component Value Date   WBC 8.7 11/25/2021   HGB 15.5 11/25/2021   HCT 46.2 11/25/2021   MCV 87.9 11/25/2021   PLT 177.0 11/25/2021   Lab Results  Component Value Date   CREATININE 1.05 05/13/2022   BUN 14 05/13/2022   NA 138 05/13/2022   K 3.9 05/13/2022   CL 100 05/13/2022   CO2 30 05/13/2022   Lab Results  Component Value Date   ALT 46 05/13/2022   AST 34 05/13/2022   ALKPHOS 51 05/13/2022   BILITOT 1.3 (H) 05/13/2022   Lab Results  Component Value Date   CHOL 133 05/13/2022   Lab Results  Component Value Date   HDL 41.40 05/13/2022   Lab Results  Component Value Date   LDLCALC 69 05/13/2022   Lab Results  Component Value Date   TRIG 113.0 05/13/2022   Lab Results  Component Value Date   CHOLHDL 3 05/13/2022   Lab Results  Component Value Date   HGBA1C 5.8 02/10/2022   ASSESSMENT AND PLAN:   #1 Health maintenance exam: Reviewed age and gender appropriate health maintenance issues (prudent diet, regular exercise, health risks of tobacco and excessive alcohol, use of seatbelts, fire alarms in home, use of sunscreen).  Also reviewed age and gender appropriate health screening as well as vaccine recommendations. Vaccines: ALL UTD. Labs:  fasting HP labs + Hba1c (prediabetes) ordered. Prostate ca screening: average risk patient= as per latest guidelines, start screening at 87 yrs of age. Colon ca screening: Recall 2033.  #2 HTN, well irbesartan 300 mg a day and HCTZ 25 mg a day.  3.  Hypercholesterolemia.  Doing well on Crestor 20 mg a day. Lipid panel and hepatic panel today.  4.  Obesity/weight management.  He continues to work on diet and exercise. Unfortunately [employer] will not cover GLP-1 agonist.  An After Visit Summary was printed and given to the patient.  FOLLOW UP:  Return in about 6 months (around 06/05/2023) for routine chronic illness f/u.  Signed:  Santiago Bumpers, MD           12/03/2022

## 2023-01-02 ENCOUNTER — Other Ambulatory Visit: Payer: Self-pay | Admitting: Family Medicine

## 2023-01-19 ENCOUNTER — Other Ambulatory Visit: Payer: Self-pay | Admitting: Internal Medicine

## 2023-01-27 ENCOUNTER — Other Ambulatory Visit: Payer: Self-pay | Admitting: Family Medicine

## 2023-02-04 ENCOUNTER — Other Ambulatory Visit: Payer: Self-pay | Admitting: Family Medicine

## 2023-02-13 ENCOUNTER — Other Ambulatory Visit: Payer: Self-pay | Admitting: Internal Medicine

## 2023-03-11 ENCOUNTER — Other Ambulatory Visit: Payer: Self-pay | Admitting: Internal Medicine

## 2023-03-26 ENCOUNTER — Other Ambulatory Visit: Payer: Self-pay | Admitting: Internal Medicine

## 2023-04-04 ENCOUNTER — Ambulatory Visit
Admission: RE | Admit: 2023-04-04 | Discharge: 2023-04-04 | Disposition: A | Discharge status: Home / Self Care | Payer: BC Managed Care – PPO | Source: Ambulatory Visit | Attending: Family Medicine | Admitting: Family Medicine

## 2023-04-04 VITALS — BP 131/88 | HR 79 | Temp 98.4°F | Resp 17 | Ht 72.0 in | Wt 255.0 lb

## 2023-04-04 DIAGNOSIS — R051 Acute cough: Secondary | ICD-10-CM

## 2023-04-04 DIAGNOSIS — J209 Acute bronchitis, unspecified: Secondary | ICD-10-CM | POA: Diagnosis not present

## 2023-04-04 MED ORDER — BENZONATATE 200 MG PO CAPS: 200.0000 mg | ORAL_CAPSULE | Freq: Three times a day (TID) | ORAL | 0 refills | Status: DC | PRN

## 2023-04-04 MED ORDER — PREDNISONE 50 MG PO TABS: ORAL_TABLET | ORAL | 0 refills | Status: DC

## 2023-04-04 MED ORDER — AZITHROMYCIN 250 MG PO TABS: ORAL_TABLET | ORAL | 0 refills | Status: DC

## 2023-04-04 NOTE — ED Provider Notes (Signed)
[facility]    CSN: [id number] Arrival date & time: 04/04/23  1022      History   Chief Complaint Chief Complaint  Patient presents with   Cough    Entered by patient    HPI [facility] is a 47 y.o. male.   HPI  Patient states his son at home has COVID.  Patient states that he has had a cough for just over a week.  Unremitting.  Making his chest hurt.  Keeping him awake at night.  Coughing up scant sputum.  His COVID test at home was negative.  Is scheduled to go out of town and desires treatment.  Past Medical History:  Diagnosis Date   Diarrhea 03/2015   GI eval (Dr. Dulce Sellar, [facility]) ?malabsorptive syndrome?  Work-up pending   Dislocation, coccyx closed    [facility]   Elevated coronary artery calcium score 12/03/2021   99th %'tile->rosuvastatin started   Elevated transaminase level 2017, 2019   Very mild ALT elev: Hep serologies neg. hepatic steatosis noted on CT cardiac calcium score 12/2021.   GERD (gastroesophageal reflux disease)    Gout    R MTP jt   Hepatic steatosis    History of Salmonella gastroenteritis 04/2021   HTN (hypertension)    Hyperlipidemia    Nephrolithiasis    Nonexertional chest pain    Ruled out in ED 09/2018.  Isolated occurence.   Obesity, Class II, BMI 35-39.9    Vasomotor rhinitis     Patient Active Problem List   Diagnosis Date Noted   Obesity (BMI 30-39.9) 05/02/2019   Essential hypertension 11/19/2015    Past Surgical History:  Procedure Laterality Date   COLONOSCOPY     06/24/22 NORMAL. Recall 69yr   Coronary calcium score  12/03/2021   99th %'tile (LAD and RCA)   EYE SURGERY     Lasik   TONSILLECTOMY AND ADENOIDECTOMY  08/04/1978   VASECTOMY  08/04/2012   WISDOM TOOTH EXTRACTION         Home Medications    Prior to Admission medications   Medication Sig Start Date End Date Taking? Authorizing Provider  allopurinol (ZYLOPRIM) 300 MG tablet TAKE 1 TABLET BY MOUTH EVERY DAY 01/28/23  Yes McGowen,  Maryjean Morn, MD  aspirin EC 81 MG tablet Take 81 mg by mouth daily.   Yes [provider]  azithromycin (ZITHROMAX Z-PAK) 250 MG tablet Take 2 pills right away.  After this take 1 pill a day until gone 04/04/23  Yes Eustace Moore, MD  benzonatate (TESSALON) 200 MG capsule Take 1 capsule (200 mg total) by mouth 3 (three) times daily as needed for cough. 04/04/23  Yes Eustace Moore, MD  hydrochlorothiazide (HYDRODIURIL) 25 MG tablet TAKE 1 TABLET (25 MG TOTAL) BY MOUTH DAILY. 01/02/23  Yes McGowen, Maryjean Morn, MD  irbesartan (AVAPRO) 300 MG tablet TAKE 1 TABLET BY MOUTH EVERY DAY 11/05/22  Yes McGowen, Maryjean Morn, MD  Liraglutide -Weight Management (SAXENDA) 18 MG/3ML SOPN Inject 0.6 mg into the skin daily. 09/16/22  Yes McGowen, Maryjean Morn, MD  pantoprazole (PROTONIX) 40 MG tablet TAKE 1 TABLET BY MOUTH EVERY DAY 02/04/23  Yes McGowen, Maryjean Morn, MD  predniSONE (DELTASONE) 50 MG tablet Take once a day for 5 days.  Take with food 04/04/23  Yes Eustace Moore, MD  rosuvastatin (CRESTOR) 20 MG tablet Take 1 tablet (20 mg total) by mouth daily. Please call [phone] to schedule an overdue appointment for future refills.  Thank you. Final attempt. 03/26/23  Yes Orbie Pyo, MD  sildenafil (VIAGRA) 50 MG tablet 1-2 tabs po qd prn intercourse 08/18/22  Yes McGowen, Maryjean Morn, MD    Family History Family History  Problem Relation Age of Onset   Hypertension Mother    Diabetes Mother    Heart attack Mother        Normal coronaries @ [facility]   Colon polyps Father    Hypertension Father    Lung cancer Maternal Grandfather        mesothelioma   Heart attack Paternal Grandfather        in 78s   Esophageal cancer Neg Hx    Stomach cancer Neg Hx    Rectal cancer Neg Hx    Colon cancer Neg Hx     Social History Social History   Tobacco Use   Smoking status: Never   Smokeless tobacco: Never  Vaping Use   Vaping status: Never Used  Substance Use Topics   Alcohol use: No   Drug use: Yes     Types: Marijuana    Comment: edibles-last weekend     Allergies   Lotrel [amlodipine besy-benazepril hcl]   Review of Systems Review of Systems  See HPI Physical Exam Triage Vital Signs ED Triage Vitals  Encounter Vitals Group     BP 04/04/23 1039 131/88     Systolic BP Percentile --      Diastolic BP Percentile --      Pulse Rate 04/04/23 1039 79     Resp 04/04/23 1039 17     Temp 04/04/23 1039 98.4 F (36.9 C)     Temp Source 04/04/23 1039 Oral     SpO2 04/04/23 1039 96 %     Weight 04/04/23 1038 255 lb (115.7 kg)     Height 04/04/23 1038 6' (1.829 m)     Head Circumference --      Peak Flow --      Pain Score 04/04/23 1038 0     Pain Loc --      Pain Education --      Exclude from Growth Chart --    No data found.  Updated Vital Signs BP 131/88 (BP Location: Left Arm)   Pulse 79   Temp 98.4 F (36.9 C) (Oral)   Resp 17   Ht 6' (1.829 m)   Wt 115.7 kg   SpO2 96%   BMI 34.58 kg/m       Physical Exam Constitutional:      General: He is not in acute distress.    Appearance: He is well-developed. He is ill-appearing.     Comments: Large frame, muscular  HENT:     Head: Normocephalic and atraumatic.     Right Ear: Tympanic membrane normal.     Left Ear: Tympanic membrane normal.     Nose: Nose normal. No rhinorrhea.     Mouth/Throat:     Pharynx: No posterior oropharyngeal erythema.  Eyes:     Conjunctiva/sclera: Conjunctivae normal.     Pupils: Pupils are equal, round, and reactive to light.  Cardiovascular:     Rate and Rhythm: Normal rate and regular rhythm.     Heart sounds: Normal heart sounds.  Pulmonary:     Effort: Pulmonary effort is normal. No respiratory distress.     Breath sounds: Normal breath sounds.  Abdominal:     General: There is no distension.     Palpations: Abdomen is soft.  Musculoskeletal:        General: Normal range of motion.     Cervical back: Normal range of motion.  Lymphadenopathy:     Cervical: No cervical  adenopathy.  Skin:    General: Skin is warm and dry.  Neurological:     Mental Status: He is alert.      UC Treatments / Results  Labs (all labs ordered are listed, but only abnormal results are displayed) Labs Reviewed - No data to display  EKG   Radiology No results found.  Procedures Procedures (including critical care time)  Medications Ordered in UC Medications - No data to display  Initial Impression / Assessment and Plan / UC Course  I have reviewed the triage vital signs and the nursing notes.  Pertinent labs & imaging results that were available during my care of the patient were reviewed by me and considered in my medical decision making (see chart for details).     Reviewed that most bronchitis is caused by a virus and that the cough can last for a month.  He is having worsening symptoms after a week and coughing spells that make his chest hurt. Final Clinical Impressions(s) / UC Diagnoses   Final diagnoses:  Acute bronchitis, unspecified organism  Acute cough     Discharge Instructions      Take the azithromycin as directed.  2 pills a day then 1 a day until gone Take prednisone once a day for 5 days Drink lots of fluids Take Tessalon 2-3 times a day for coughing Continue [facility] Call for problems   ED Prescriptions     Medication Sig Dispense Auth. Provider   azithromycin (ZITHROMAX Z-PAK) 250 MG tablet Take 2 pills right away.  After this take 1 pill a day until gone 6 tablet Eustace Moore, MD   benzonatate (TESSALON) 200 MG capsule Take 1 capsule (200 mg total) by mouth 3 (three) times daily as needed for cough. 21 capsule Eustace Moore, MD   predniSONE (DELTASONE) 50 MG tablet Take once a day for 5 days.  Take with food 5 tablet Eustace Moore, MD      PDMP not reviewed this encounter.   Eustace Moore, MD 04/04/23 1120

## 2023-04-04 NOTE — ED Triage Notes (Signed)
Pt states that he has a cough and chest congestion. X1 week

## 2023-04-04 NOTE — Discharge Instructions (Signed)
Take the azithromycin as directed.  2 pills a day then 1 a day until gone Take prednisone once a day for 5 days Drink lots of fluids Take Tessalon 2-3 times a day for coughing Continue [facility] Call for problems

## 2023-04-11 ENCOUNTER — Other Ambulatory Visit: Payer: Self-pay | Admitting: Internal Medicine

## 2023-04-20 ENCOUNTER — Other Ambulatory Visit: Payer: Self-pay | Admitting: Internal Medicine

## 2023-04-22 ENCOUNTER — Encounter: Payer: Self-pay | Admitting: Family Medicine

## 2023-04-22 ENCOUNTER — Telehealth: Payer: BC Managed Care – PPO | Admitting: Family Medicine

## 2023-04-22 VITALS — Ht 72.0 in | Wt 255.0 lb

## 2023-04-22 DIAGNOSIS — E7800 Pure hypercholesterolemia, unspecified: Secondary | ICD-10-CM | POA: Diagnosis not present

## 2023-04-22 MED ORDER — IRBESARTAN 300 MG PO TABS: 300.0000 mg | ORAL_TABLET | Freq: Every day | ORAL | 3 refills | Status: DC

## 2023-04-22 MED ORDER — HYDROCHLOROTHIAZIDE 25 MG PO TABS: 25.0000 mg | ORAL_TABLET | Freq: Every day | ORAL | 3 refills | Status: DC

## 2023-04-22 MED ORDER — ROSUVASTATIN CALCIUM 20 MG PO TABS: 20.0000 mg | ORAL_TABLET | Freq: Every day | ORAL | 3 refills | Status: DC

## 2023-04-22 NOTE — Progress Notes (Signed)
Virtual Visit via Video Note  I connected with [facility]  on 04/22/23 at  4:00 PM EDT by a video enabled telemedicine application and verified that I am speaking with the correct person using two identifiers.  Location patient: [place] Location provider:work or home office Persons participating in the virtual visit: patient, provider  I discussed the limitations and requested verbal permission for telemedicine visit. The patient expressed understanding and agreed to proceed.  CC: 47 y/o male being seen today for f/u HTN, HLD, and wt mgmt. A/P as of last visit: "1 HTN, well irbesartan 300 mg a day and HCTZ 25 mg a day.   2.  Hypercholesterolemia.  Doing well on Crestor 20 mg a day. Lipid panel and hepatic panel today.   3.  Obesity/weight management.  He continues to work on diet and exercise. Unfortunately insurer will not cover GLP-1 agonist."  INTERIM HX: All labs excellent last visit.  [facility] feels good. No problems with Crestor at all. He maintains good diet and exercise habits.   ROS: See pertinent positives and negatives per HPI.  Past Medical History:  Diagnosis Date   Diarrhea 03/2015   GI eval (Dr. Dulce Sellar, [facility]) ?malabsorptive syndrome?  Work-up pending   Dislocation, coccyx closed    [facility]   Elevated coronary artery calcium score 12/03/2021   99th %'tile->rosuvastatin started   Elevated transaminase level 2017, 2019   Very mild ALT elev: Hep serologies neg. hepatic steatosis noted on CT cardiac calcium score 12/2021.   GERD (gastroesophageal reflux disease)    Gout    R MTP jt   Hepatic steatosis    History of Salmonella gastroenteritis 04/2021   HTN (hypertension)    Hyperlipidemia    Nephrolithiasis    Nonexertional chest pain    Ruled out in ED 09/2018.  Isolated occurence.   Obesity, Class II, BMI 35-39.9    Vasomotor rhinitis     Past Surgical History:  Procedure Laterality Date   COLONOSCOPY     06/24/22 NORMAL. Recall 31yr   Coronary calcium score   12/03/2021   99th %'tile (LAD and RCA)   EYE SURGERY     Lasik   TONSILLECTOMY AND ADENOIDECTOMY  08/04/1978   VASECTOMY  08/04/2012   WISDOM TOOTH EXTRACTION       Current Outpatient Medications:    allopurinol (ZYLOPRIM) 300 MG tablet, TAKE 1 TABLET BY MOUTH EVERY DAY, Disp: 90 tablet, Rfl: 1   aspirin EC 81 MG tablet, Take 81 mg by mouth daily., Disp: , Rfl:    azithromycin (ZITHROMAX Z-PAK) 250 MG tablet, Take 2 pills right away.  After this take 1 pill a day until gone, Disp: 6 tablet, Rfl: 0   benzonatate (TESSALON) 200 MG capsule, Take 1 capsule (200 mg total) by mouth 3 (three) times daily as needed for cough., Disp: 21 capsule, Rfl: 0   hydrochlorothiazide (HYDRODIURIL) 25 MG tablet, TAKE 1 TABLET (25 MG TOTAL) BY MOUTH DAILY., Disp: 90 tablet, Rfl: 1   irbesartan (AVAPRO) 300 MG tablet, TAKE 1 TABLET BY MOUTH EVERY DAY, Disp: 90 tablet, Rfl: 1   Liraglutide -Weight Management (SAXENDA) 18 MG/3ML SOPN, Inject 0.6 mg into the skin daily., Disp: 3 mL, Rfl: 0   pantoprazole (PROTONIX) 40 MG tablet, TAKE 1 TABLET BY MOUTH EVERY DAY, Disp: 90 tablet, Rfl: 1   rosuvastatin (CRESTOR) 20 MG tablet, Take 1 tablet (20 mg total) by mouth daily. Please call [phone] to schedule an overdue appointment for future refills. Thank you.  Final attempt., Disp: 15 tablet, Rfl: 0   sildenafil (VIAGRA) 50 MG tablet, 1-2 tabs po qd prn intercourse, Disp: 5 tablet, Rfl: 5   predniSONE (DELTASONE) 50 MG tablet, Take once a day for 5 days.  Take with food (Patient not taking: Reported on 04/22/2023), Disp: 5 tablet, Rfl: 0  EXAM:  VITALS per patient if applicable:      04/22/2023    4:32 PM 04/04/2023   10:39 AM 04/04/2023   10:38 AM  Vitals with BMI  Height 6\' 0"   6\' 0"   Weight 255 lbs  255 lbs  BMI 34.58  34.58  Systolic  131   Diastolic  88   Pulse  79      GENERAL: alert, oriented, appears well and in no acute distress  HEENT: atraumatic, conjunttiva clear, no obvious abnormalities on  inspection of external nose and ears  NECK: normal movements of the head and neck  LUNGS: on inspection no signs of respiratory distress, breathing rate appears normal, no obvious gross SOB, gasping or wheezing  CV: no obvious cyanosis  MS: moves all visible extremities without noticeable abnormality  PSYCH/NEURO: pleasant and cooperative, no obvious depression or anxiety, speech and thought processing grossly intact  LABS: none today    Chemistry      Component Value Date/Time   NA 140 12/03/2022 0907   NA 139 03/15/2015 0000   K 3.8 12/03/2022 0907   CL 100 12/03/2022 0907   CO2 29 12/03/2022 0907   BUN 16 12/03/2022 0907   BUN 15 03/15/2015 0000   CREATININE 1.02 12/03/2022 0907   CREATININE 1.10 01/02/2021 1605   GLU 87 03/15/2015 0000      Component Value Date/Time   CALCIUM 9.8 12/03/2022 0907   ALKPHOS 50 12/03/2022 0907   AST 36 12/03/2022 0907   ALT 58 (H) 12/03/2022 0907   BILITOT 1.0 12/03/2022 0907   BILITOT 0.7 02/10/2022 1058     Lab Results  Component Value Date   HGBA1C 5.8 12/03/2022   Lab Results  Component Value Date   CHOL 127 12/03/2022   HDL 41.60 12/03/2022   LDLCALC 66 12/03/2022   TRIG 97.0 12/03/2022   CHOLHDL 3 12/03/2022   ASSESSMENT AND PLAN:  Discussed the following assessment and plan:  Hypercholesterolemia, doing great on Crestor 20 mg a day. Lipid panel 4 months ago showed LDL 66. No changes today.   I discussed the assessment and treatment plan with the patient. The patient was provided an opportunity to ask questions and all were answered. The patient agreed with the plan and demonstrated an understanding of the instructions.   F/u: 8-9 months CPE  Signed:  Santiago Bumpers, MD           04/22/2023

## 2023-05-05 ENCOUNTER — Other Ambulatory Visit: Payer: Self-pay | Admitting: Family Medicine

## 2023-05-06 ENCOUNTER — Other Ambulatory Visit: Payer: Self-pay | Admitting: Family Medicine

## 2023-06-02 ENCOUNTER — Encounter: Payer: Self-pay | Admitting: Internal Medicine

## 2023-06-05 ENCOUNTER — Ambulatory Visit: Payer: BC Managed Care – PPO | Admitting: Family Medicine

## 2023-06-05 ENCOUNTER — Encounter: Payer: Self-pay | Admitting: Family Medicine

## 2023-06-05 VITALS — BP 125/84 | HR 78 | Wt 257.8 lb

## 2023-06-05 DIAGNOSIS — I10 Essential (primary) hypertension: Secondary | ICD-10-CM

## 2023-06-05 DIAGNOSIS — R7303 Prediabetes: Secondary | ICD-10-CM | POA: Diagnosis not present

## 2023-06-05 DIAGNOSIS — Z23 Encounter for immunization: Secondary | ICD-10-CM | POA: Diagnosis not present

## 2023-06-05 LAB — COMPREHENSIVE METABOLIC PANEL
ALT: 39 U/L (ref 0–53)
AST: 28 U/L (ref 0–37)
Albumin: 4.4 g/dL (ref 3.5–5.2)
Alkaline Phosphatase: 47 U/L (ref 39–117)
BUN: 16 mg/dL (ref 6–23)
CO2: 32 meq/L (ref 19–32)
Calcium: 9.6 mg/dL (ref 8.4–10.5)
Chloride: 102 meq/L (ref 96–112)
Creatinine, Ser: 1.12 mg/dL (ref 0.40–1.50)
GFR: 77.27 mL/min (ref >60.00)
Glucose, Bld: 96 mg/dL (ref 70–99)
Potassium: 3.8 meq/L (ref 3.5–5.1)
Sodium: 141 meq/L (ref 135–145)
Total Bilirubin: 1 mg/dL (ref 0.2–1.2)
Total Protein: 6.8 g/dL (ref 6.0–8.3)

## 2023-06-05 LAB — HEMOGLOBIN A1C: Hgb A1c MFr Bld: 5.7 % (ref 4.6–6.5)

## 2023-06-05 MED ORDER — ALLOPURINOL 300 MG PO TABS: 300.0000 mg | ORAL_TABLET | Freq: Every day | ORAL | 1 refills | Status: DC

## 2023-06-05 NOTE — Progress Notes (Signed)
OFFICE VISIT  06/05/2023  CC:  Chief Complaint  Patient presents with   Medical Management of Chronic Issues    Pt is not fasting.     Patient is a 47 y.o. male who presents for 58-month follow-up hypertension.  INTERIM HX: I am feels well. No problems with medications.  He is not fasting today.  Past Medical History:  Diagnosis Date   Diarrhea 03/2015   GI eval (Dr. Dulce Sellar, [facility]) ?malabsorptive syndrome?  Work-up pending   Dislocation, coccyx closed    [facility]   Elevated coronary artery calcium score 12/03/2021   99th %'tile->rosuvastatin started   Elevated transaminase level 2017, 2019   Very mild ALT elev: Hep serologies neg. hepatic steatosis noted on CT cardiac calcium score 12/2021.   GERD (gastroesophageal reflux disease)    Gout    R MTP jt   Hepatic steatosis    History of Salmonella gastroenteritis 04/2021   HTN (hypertension)    Hyperlipidemia    Nephrolithiasis    Nonexertional chest pain    Ruled out in ED 09/2018.  Isolated occurence.   Obesity, Class II, BMI 35-39.9    Vasomotor rhinitis     Past Surgical History:  Procedure Laterality Date   COLONOSCOPY     06/24/22 NORMAL. Recall 77yr   Coronary calcium score  12/03/2021   99th %'tile (LAD and RCA)   EYE SURGERY     Lasik   TONSILLECTOMY AND ADENOIDECTOMY  08/04/1978   VASECTOMY  08/04/2012   WISDOM TOOTH EXTRACTION      Outpatient Medications Prior to Visit  Medication Sig Dispense Refill   aspirin EC 81 MG tablet Take 81 mg by mouth daily.     hydrochlorothiazide (HYDRODIURIL) 25 MG tablet Take 1 tablet (25 mg total) by mouth daily. 90 tablet 3   irbesartan (AVAPRO) 300 MG tablet Take 1 tablet (300 mg total) by mouth daily. 90 tablet 3   pantoprazole (PROTONIX) 40 MG tablet TAKE 1 TABLET BY MOUTH EVERY DAY 90 tablet 0   rosuvastatin (CRESTOR) 20 MG tablet Take 1 tablet (20 mg total) by mouth daily. 90 tablet 3   sildenafil (VIAGRA) 50 MG tablet 1-2 tabs po qd prn intercourse 5 tablet  5   allopurinol (ZYLOPRIM) 300 MG tablet TAKE 1 TABLET BY MOUTH EVERY DAY 90 tablet 1   No facility-administered medications prior to visit.    Allergies  Allergen Reactions   Lotrel [Amlodipine Besy-Benazepril Hcl] Other (See Comments) and Cough    Cough + sudden periods of severe fatigue    Review of Systems As per HPI  PE:    06/05/2023   10:08 AM 04/22/2023    4:32 PM 04/04/2023   10:39 AM  Vitals with BMI  Height  6\' 0"    Weight 257 lbs 13 oz 255 lbs   BMI  34.58   Systolic 125  131  Diastolic 84  88  Pulse 78  79     Physical Exam  Gen: Alert, well appearing.  Patient is oriented to person, place, time, and situation. AFFECT: pleasant, lucid thought and speech. CV: RRR, no m/r/g.   LUNGS: CTA bilat, nonlabored resps, good aeration in all lung fields. EXT: no clubbing or cyanosis.  no edema.    LABS:  Last CBC Lab Results  Component Value Date   WBC 7.1 12/03/2022   HGB 15.4 12/03/2022   HCT 45.4 12/03/2022   MCV 87.4 12/03/2022   MCH 29.1 03/28/2020   RDW 13.5  12/03/2022   PLT 177.0 12/03/2022   Last metabolic panel Lab Results  Component Value Date   GLUCOSE 90 12/03/2022   NA 140 12/03/2022   K 3.8 12/03/2022   CL 100 12/03/2022   CO2 29 12/03/2022   BUN 16 12/03/2022   CREATININE 1.02 12/03/2022   GFR 86.76 12/03/2022   CALCIUM 9.8 12/03/2022   PROT 7.1 12/03/2022   ALBUMIN 4.5 12/03/2022   BILITOT 1.0 12/03/2022   ALKPHOS 50 12/03/2022   AST 36 12/03/2022   ALT 58 (H) 12/03/2022   ANIONGAP 12 03/28/2020   Last lipids Lab Results  Component Value Date   CHOL 127 12/03/2022   HDL 41.60 12/03/2022   LDLCALC 66 12/03/2022   TRIG 97.0 12/03/2022   CHOLHDL 3 12/03/2022   Last hemoglobin A1c Lab Results  Component Value Date   HGBA1C 5.8 12/03/2022   Last thyroid functions Lab Results  Component Value Date   TSH 1.76 12/03/2022   IMPRESSION AND PLAN:  #1 hypertension, well-controlled on irbesartan 300 mg a day and HCTZ 25 mg  a day. Electrolytes and creatinine monitoring today.  2.  Prediabetes. Continue good lifestyle modifications. Hemoglobin A1c monitoring today.  #3 hypercholesterolemia.  Has been doing well on rosuvastatin 20 mg a day. His last LDL was 66 about 6 months ago. We will repeat this in 6 months.  An After Visit Summary was printed and given to the patient.  FOLLOW UP: Return in about 6 months (around 12/03/2023) for annual CPE (fasting). Next CPE 12/2023 Signed:  Santiago Bumpers, MD           06/05/2023

## 2023-11-13 ENCOUNTER — Other Ambulatory Visit: Payer: Self-pay | Admitting: Family Medicine

## 2023-12-08 ENCOUNTER — Other Ambulatory Visit: Payer: Self-pay | Admitting: Family Medicine

## 2023-12-15 ENCOUNTER — Other Ambulatory Visit: Payer: Self-pay | Admitting: Family Medicine

## 2023-12-24 ENCOUNTER — Encounter: Payer: Self-pay | Admitting: Family Medicine

## 2023-12-24 ENCOUNTER — Telehealth: Payer: Self-pay

## 2023-12-24 ENCOUNTER — Ambulatory Visit: Admitting: Family Medicine

## 2023-12-24 VITALS — BP 110/70 | HR 89 | Temp 98.4°F | Ht 72.0 in | Wt 239.6 lb

## 2023-12-24 DIAGNOSIS — I10 Essential (primary) hypertension: Secondary | ICD-10-CM

## 2023-12-24 DIAGNOSIS — E785 Hyperlipidemia, unspecified: Secondary | ICD-10-CM | POA: Diagnosis not present

## 2023-12-24 DIAGNOSIS — K219 Gastro-esophageal reflux disease without esophagitis: Secondary | ICD-10-CM | POA: Diagnosis not present

## 2023-12-24 MED ORDER — ALLOPURINOL 300 MG PO TABS: 300.0000 mg | ORAL_TABLET | Freq: Every day | ORAL | 3 refills | Status: AC

## 2023-12-24 MED ORDER — SILDENAFIL CITRATE 50 MG PO TABS: ORAL_TABLET | ORAL | 5 refills | Status: AC

## 2023-12-24 MED ORDER — PANTOPRAZOLE SODIUM 40 MG PO TBEC
40.0000 mg | DELAYED_RELEASE_TABLET | Freq: Every day | ORAL | 3 refills | Status: AC
Start: 2023-12-24 — End: ?

## 2023-12-24 NOTE — Progress Notes (Signed)
 Office Note 12/24/2023  CC:  Chief Complaint  Patient presents with   Medical Management of Chronic Issues   Patient is a 48 y.o. male who is here for annual health maintenance exam and 6 mo f/u GERD, HTN, HLD, and hepatic steatosis. A/P as of last visit: "#1 hypertension, well-controlled on irbesartan  300 mg a day and HCTZ 25 mg a day. Electrolytes and creatinine monitoring today.   2.  Prediabetes. Continue good lifestyle modifications. Hemoglobin A1c monitoring today.   #3 hypercholesterolemia.  Has been doing well on rosuvastatin  20 mg a day. His last LDL was 66 about 6 months ago. We will repeat this in 6 months."  INTERIM HX: Hunter Wiley is doing well. He ran out of pantoprazole  a few days ago and has noted significant return of his reflux in the last day or 2.  He has been on semaglutide since I last saw him in he is doing well on it.  He has lost 18 pounds compared to last visit here. He has no side effects.  Past Medical History:  Diagnosis Date   Diarrhea 03/2015   GI eval (Dr. Kimble Pennant, [facility]) ?malabsorptive syndrome?  Work-up pending   Dislocation, coccyx closed    [facility]   Elevated coronary artery calcium  score 12/03/2021   99th %'tile->rosuvastatin  started   Elevated transaminase level 2017, 2019   Very mild ALT elev: Hep serologies neg. hepatic steatosis noted on CT cardiac calcium  score 12/2021.   GERD (gastroesophageal reflux disease)    Gout    R MTP jt   Hepatic steatosis    History of Salmonella gastroenteritis 04/2021   HTN (hypertension)    Hyperlipidemia    Nephrolithiasis    Nonexertional chest pain    Ruled out in ED 09/2018.  Isolated occurence.   Obesity, Class II, BMI 35-39.9    Vasomotor rhinitis     Past Surgical History:  Procedure Laterality Date   COLONOSCOPY     06/24/22 NORMAL. Recall 51yr   Coronary calcium  score  12/03/2021   99th %'tile (LAD and RCA)   EYE SURGERY     Lasik   TONSILLECTOMY AND ADENOIDECTOMY  08/04/1978    VASECTOMY  08/04/2012   WISDOM TOOTH EXTRACTION      Family History  Problem Relation Age of Onset   Hypertension Mother    Diabetes Mother    Heart attack Mother        Normal coronaries @ cath   Colon polyps Father    Hypertension Father    Lung cancer Maternal Grandfather        mesothelioma   Heart attack Paternal Grandfather        in 42s   Esophageal cancer Neg Hx    Stomach cancer Neg Hx    Rectal cancer Neg Hx    Colon cancer Neg Hx     Social History   Socioeconomic History   Marital status: Married    Spouse name: Not on file   Number of children: Not on file   Years of education: Not on file   Highest education level: Not on file  Occupational History   Not on file  Tobacco Use   Smoking status: Never   Smokeless tobacco: Never  Vaping Use   Vaping status: Never Used  Substance and Sexual Activity   Alcohol use: No   Drug use: Yes    Types: Marijuana    Comment: edibles-last weekend   Sexual activity: Not on file  Other  Topics Concern   Not on file  Social History Narrative   Married, 2 sons.   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   Lives in [place].   No T/A/Ds.   No FH of prostate or colon cancer   Social Drivers of Corporate investment banker Strain: Not on file  Food Insecurity: Not on file  Transportation Needs: Not on file  Physical Activity: Not on file  Stress: Not on file  Social Connections: Unknown (12/13/2021)   Received [facility]   Social Network    Social Network: Not on file  Intimate Partner Violence: Unknown (11/04/2021)   Received from [facility]   HITS    Physically Hurt: Not on file    Insult or Talk Down To: Not on file    Threaten Physical Harm: Not on file    Scream or Curse: Not on file    Outpatient Medications Prior to Visit  Medication Sig Dispense Refill   aspirin  EC 81 MG tablet Take 81 mg by mouth daily.     diclofenac (VOLTAREN) 75 MG EC tablet Take 75 mg by  mouth 2 (two) times daily.     hydrochlorothiazide  (HYDRODIURIL ) 25 MG tablet Take 1 tablet (25 mg total) by mouth daily. 90 tablet 3   irbesartan  (AVAPRO ) 300 MG tablet Take 1 tablet (300 mg total) by mouth daily. 90 tablet 3   rosuvastatin  (CRESTOR ) 20 MG tablet Take 1 tablet (20 mg total) by mouth daily. 90 tablet 3   allopurinol  (ZYLOPRIM ) 300 MG tablet Take 1 tablet (300 mg total) by mouth daily. 90 tablet 1   pantoprazole  (PROTONIX ) 40 MG tablet Take 1 tablet (40 mg total) by mouth daily. OFFICE VISIT NEEDED FOR FURTHER REFILLS 30 tablet 0   sildenafil  (VIAGRA ) 50 MG tablet 1-2 tabs po qd prn intercourse 5 tablet 5   No facility-administered medications prior to visit.    Allergies  Allergen Reactions   Lotrel [Amlodipine  Besy-Benazepril  Hcl] Other (See Comments) and Cough    Cough + sudden periods of severe fatigue   PE;    12/24/2023    3:05 PM 06/05/2023   10:08 AM 04/22/2023    4:32 PM  Vitals with BMI  Height 6\' 0"   6\' 0"   Weight 239 lbs 10 oz 257 lbs 13 oz 255 lbs  BMI 32.49  34.58  Systolic 110 125   Diastolic 70 84   Pulse 89 78      Gen: Alert, well appearing.  Patient is oriented to person, place, time, and situation.` AFFECT: pleasant, lucid thought and speech. No further exam today  Pertinent labs:  Lab Results  Component Value Date   TSH 1.76 12/03/2022   Lab Results  Component Value Date   WBC 7.1 12/03/2022   HGB 15.4 12/03/2022   HCT 45.4 12/03/2022   MCV 87.4 12/03/2022   PLT 177.0 12/03/2022   Lab Results  Component Value Date   CREATININE 1.12 06/05/2023   BUN 16 06/05/2023   NA 141 06/05/2023   K 3.8 06/05/2023   CL 102 06/05/2023   CO2 32 06/05/2023   Lab Results  Component Value Date   ALT 39 06/05/2023   AST 28 06/05/2023   ALKPHOS 47 06/05/2023   BILITOT 1.0 06/05/2023   Lab Results  Component Value Date   CHOL 127 12/03/2022   Lab Results  Component Value Date   HDL 41.60 12/03/2022   Lab Results  Component Value  Date   LDLCALC 66 12/03/2022   Lab Results  Component Value Date   TRIG 97.0 12/03/2022   Lab Results  Component Value Date   CHOLHDL 3 12/03/2022   Lab Results  Component Value Date   HGBA1C 5.7 06/05/2023   ASSESSMENT AND PLAN:   GERD, long-term Protonix  40 mg/day. Refilled #90 with 3 refills today.  Hypertension, hyperlipidemia, obesity--> doing very well.  Continue HCTZ 25 mg a day, irbesartan  300 mg daily, rosuvastatin  20 mg a day, and aspirin  81 mg a day.  Next CPE in about 6 months.  An After Visit Summary was printed and given to the patient.  FOLLOW UP:  Return in about 6 months (around 06/25/2024) for annual CPE (fasting).  Signed:  Arletha Lady, MD           12/24/2023

## 2023-12-24 NOTE — Telephone Encounter (Signed)
 Copied from CRM [id number]. Topic: Clinical - Prescription Issue >> Dec 24, 2023  3:55 PM Hunter Wiley wrote: Reason for CRM: Patient called in due to receiving a message stating Prescription allopurinol  (ZYLOPRIM ) 300 MG tablet is not covered by insurance and would like for something that will/can be covered by insurance.  Please advise on alternative. Separate telephone encounter will have to be made so rx will not print.

## 2023-12-24 NOTE — Telephone Encounter (Signed)
 Please advise if you can provide assistance

## 2023-12-24 NOTE — Telephone Encounter (Signed)
 Can you see if our clinical pharmacist team can help with this (?different strength allopurinol  tab covered.  ? Uloric covered?  ?colchicine covered). thx

## 2023-12-25 NOTE — Telephone Encounter (Signed)
   12/25/2023 Name: Hunter Wiley MRN: [id number] DOB: April 24, 1976  Subjective: Patient called to PCP office stating that allopurinol  300mg  was not covered by his [employer] [place] [id number] Employee / Teachers plan. Asked for alternative.   Checked his BCBS plan and allopurinol  is covered. Suspected maybe it as just too soon because it was last filled 10/17/2023. Called [facility] and they had allopurinol  to be filled 6/10 but they did try to refill it and it was covered and went thru without any problems.   Spoke to patient to let him know - he would like to pick up allopurinol  and states he has some at home. Hunter Wiley states he was actually having trouble getting pantoprazole  filled but it was filled yesterday.    Medications Reviewed Today     Reviewed by Cecilie Coffee, RPH-CPP (Pharmacist) on 12/25/23 at 0909  Med List Status: <None>   Medication Order Taking? Sig Documenting Provider Last Dose Status Informant  allopurinol  (ZYLOPRIM ) 300 MG tablet [id number]  Take 1 tablet (300 mg total) by mouth daily. Shelvia Dick, MD  Active   aspirin  EC 81 MG tablet [id number] No Take 81 mg by mouth daily. [provider] Taking Active Self  diclofenac (VOLTAREN) 75 MG EC tablet [id number] No Take 75 mg by mouth 2 (two) times daily. [provider] Taking Active   hydrochlorothiazide  (HYDRODIURIL ) 25 MG tablet [id number] No Take 1 tablet (25 mg total) by mouth daily. McGowen, Philip H, MD Taking Active   irbesartan  (AVAPRO ) 300 MG tablet [id number] No Take 1 tablet (300 mg total) by mouth daily. McGowen, Philip H, MD Taking Active   pantoprazole  (PROTONIX ) 40 MG tablet [id number]  Take 1 tablet (40 mg total) by mouth daily. McGowen, Philip H, MD  Active   rosuvastatin  (CRESTOR ) 20 MG tablet [id number] No Take 1 tablet (20 mg total) by mouth daily. McGowen, Philip H, MD Taking Active   sildenafil  (VIAGRA ) 50 MG tablet [id number]  1-2 tabs po qd prn intercourse McGowen, Minetta Aly, MD  Active              Cecilie Coffee, PharmD Clinical Pharmacist [facility]  [facility] [phone]

## 2023-12-25 NOTE — Telephone Encounter (Signed)
 Thank you so much

## 2024-01-28 IMAGING — CT CT CARDIAC CORONARY ARTERY CALCIUM SCORE
4 of 5 series · 8 of 20 positions shown, 9 images · non-contrast
Comparison: None.

Addendum:
CLINICAL DATA: Cardiovascular Disease Risk stratification

EXAM:
Coronary Calcium Score
MEDICATIONS:
MEDICATIONS
None
TECHNIQUE: A gated, non-contrast computed tomography scan of the heart was
performed using 3mm slice thickness. Axial images were analyzed on a
dedicated workstation. Calcium scoring of the coronary arteries was
performed using the Agatston method.

[Series 4: ax lung · axial · 0.39mm/px · z∈[+1282,+1339]mm · 2 of 57 slices shown (1 of 2)]
[im 19/57  lung]
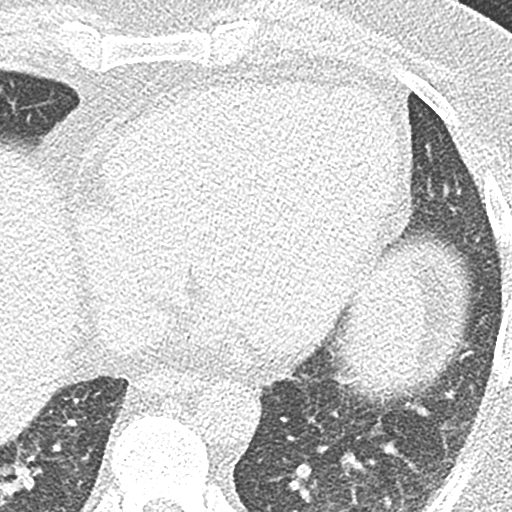
[im 38/57  lung]
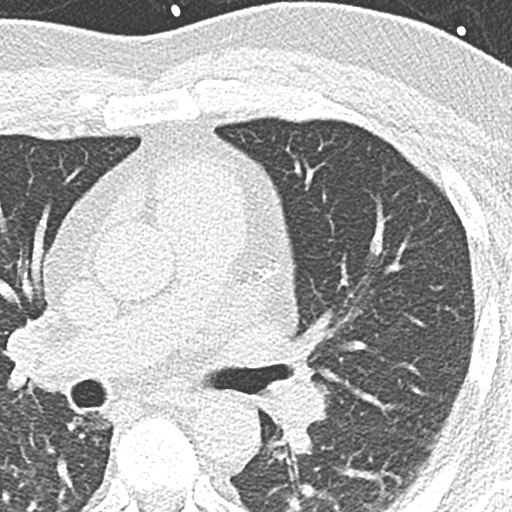

[Series 6: ax st · axial · 0.39mm/px · z∈[+1282,+1339]mm · 2 of 57 slices shown, 3 images (1 of 2)]
[im 19/57  vessel]
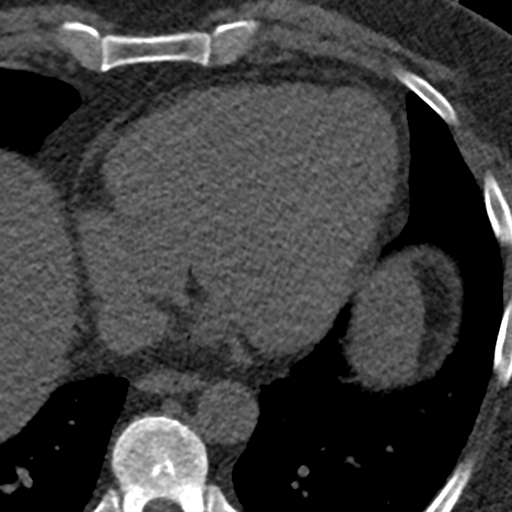
[im 19/57  lung]
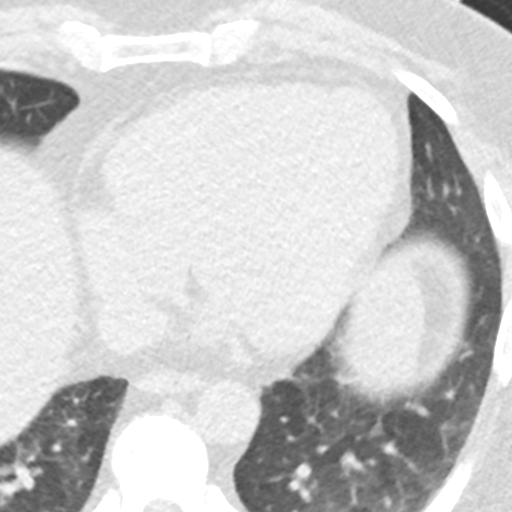
[im 38/57  vessel]
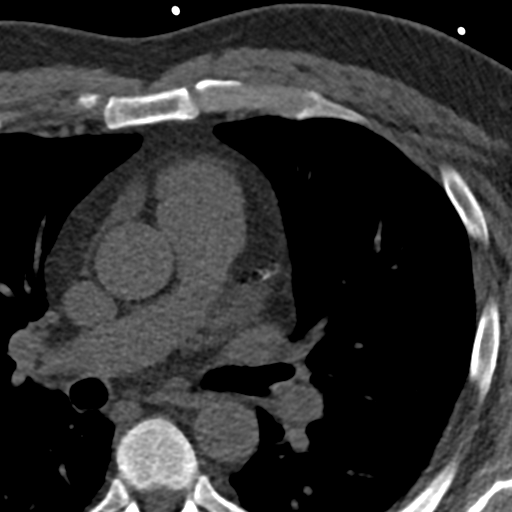

[Series 7: ax st · axial · 0.72mm/px · z∈[+1282,+1339]mm · 2 of 57 slices shown (2 of 2)]
[im 19/57  vessel]
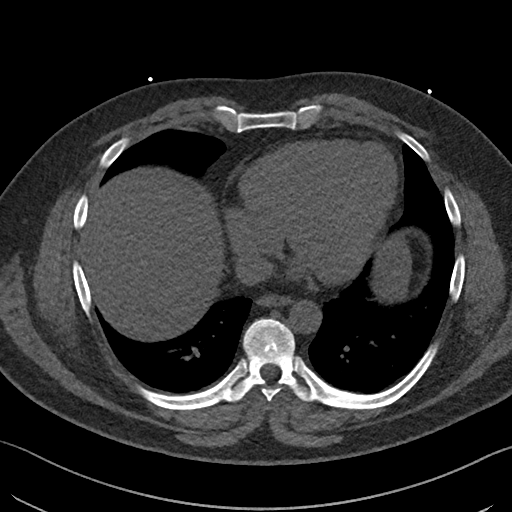
[im 38/57  vessel]
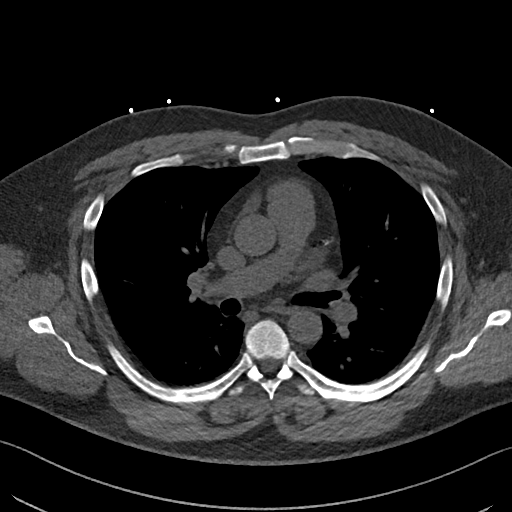

[Series 8: ax lung · axial · 0.72mm/px · z∈[+1282,+1339]mm · 2 of 57 slices shown (2 of 2)]
[im 19/57  lung]
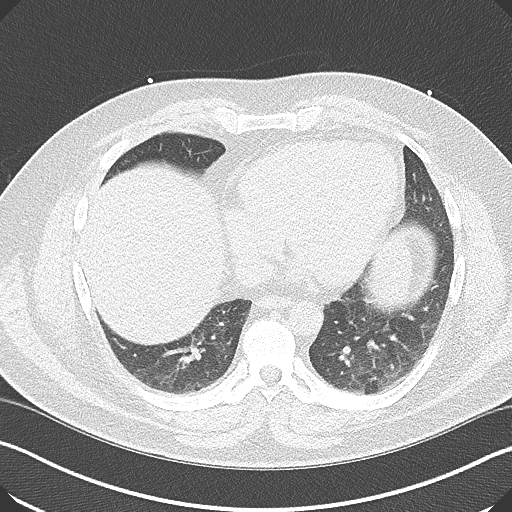
[im 38/57  lung]
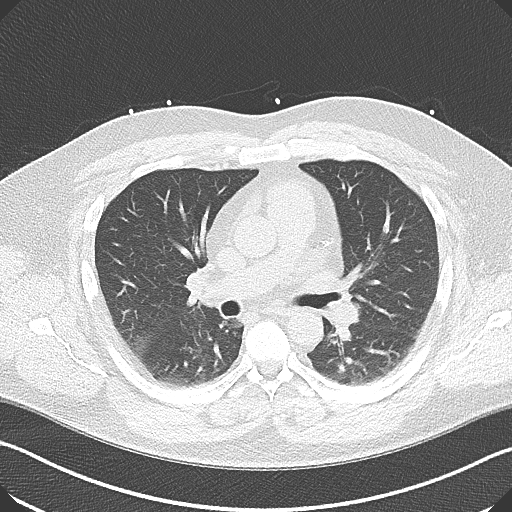

[8 of 20 positions shown; findings below may reference images not displayed]

FINDINGS: Coronary arteries: Normal origins.

Coronary Calcium Score:

Left main: 0

Left anterior descending artery: 666

Left circumflex artery: 0

Right coronary artery:

Total: 712

Percentile: 99th

Pericardium: Normal.

Ascending Aorta: Normal caliber.

Non-cardiac: See separate report from [REDACTED].
IMPRESSION: Coronary calcium score of 712 Agatston units. This was 99th
percentile for age-, race-, and sex-matched controls.



If CAC=0, it is reasonable to withhold statin therapy and reassess
in 5 to 10 years, as long as higher risk conditions are absent
(diabetes mellitus, family history of premature CHD in first degree
relatives (males <55 years; females <65 years), cigarette smoking,
or LDL >=190 mg/dL).

If CAC is 1 to 99, it is reasonable to initiate statin therapy for
patients >=55 years of age.

If CAC is >=100 or >=75th percentile, it is reasonable to initiate
statin therapy at any age.

Cardiology referral should be considered for patients with CAC
scores >=400 or >=75th percentile.

*7113 AHA/ACC/AACVPR/AAPA/ABC/EBADAT/BOKOLIYA/DARLY/Dhedy/MINOR/SCHUBERT/PARADISE
Guideline on the Management of Blood Cholesterol: A Report of the
American College of Cardiology/American Heart Association Task Force
on Clinical Practice Guidelines. J Am Coll Cardiol.
9882;73(24):9941-9222.

EXAM:
OVER-READ INTERPRETATION  CT CHEST

The following report is an over-read performed by radiologist Dr.
over-read does not include interpretation of cardiac or coronary
anatomy or pathology. The coronary calcium score interpretation by
the cardiologist is attached.
FINDINGS: No significant noncardiac vascular findings. Visualized mediastinum
and hilar regions demonstrate no lymphadenopathy or masses.
Visualized lungs show no evidence of pulmonary edema, consolidation,
pneumothorax, nodule or pleural fluid. Visualized upper abdomen
demonstrates evidence of hepatic steatosis. Visualized bony
structures are unremarkable.
IMPRESSION: Evidence of hepatic steatosis.

*** End of Addendum ***
FINDINGS: Coronary arteries: Normal origins.

Coronary Calcium Score:

Left main: 0

Left anterior descending artery: 666

Left circumflex artery: 0

Right coronary artery:

Total: 712

Percentile: 99th

Pericardium: Normal.

Ascending Aorta: Normal caliber.

Non-cardiac: See separate report from [REDACTED].
IMPRESSION: Coronary calcium score of 712 Agatston units. This was 99th
percentile for age-, race-, and sex-matched controls.



If CAC=0, it is reasonable to withhold statin therapy and reassess
in 5 to 10 years, as long as higher risk conditions are absent
(diabetes mellitus, family history of premature CHD in first degree
relatives (males <55 years; females <65 years), cigarette smoking,
or LDL >=190 mg/dL).

If CAC is 1 to 99, it is reasonable to initiate statin therapy for
patients >=55 years of age.

If CAC is >=100 or >=75th percentile, it is reasonable to initiate
statin therapy at any age.

Cardiology referral should be considered for patients with CAC
scores >=400 or >=75th percentile.

*7113 AHA/ACC/AACVPR/AAPA/ABC/EBADAT/BOKOLIYA/DARLY/Dhedy/MINOR/SCHUBERT/PARADISE
Guideline on the Management of Blood Cholesterol: A Report of the
American College of Cardiology/American Heart Association Task Force
on Clinical Practice Guidelines. J Am Coll Cardiol.
9882;73(24):9941-9222.

## 2024-04-13 ENCOUNTER — Other Ambulatory Visit: Payer: Self-pay | Admitting: Family Medicine

## 2024-07-10 ENCOUNTER — Other Ambulatory Visit: Payer: Self-pay | Admitting: Family Medicine

## 2024-08-04 ENCOUNTER — Other Ambulatory Visit: Payer: Self-pay | Admitting: Family Medicine

## 2024-08-05 ENCOUNTER — Other Ambulatory Visit: Payer: Self-pay | Admitting: Family Medicine

## 2024-08-06 ENCOUNTER — Other Ambulatory Visit: Payer: Self-pay | Admitting: Family Medicine

## 2024-08-09 ENCOUNTER — Other Ambulatory Visit: Payer: Self-pay | Admitting: Family Medicine

## 2024-08-12 ENCOUNTER — Ambulatory Visit: Admitting: Family Medicine

## 2024-08-12 ENCOUNTER — Encounter: Payer: Self-pay | Admitting: Family Medicine

## 2024-08-12 VITALS — BP 99/66 | HR 82 | Temp 98.0°F | Ht 72.0 in

## 2024-08-12 DIAGNOSIS — R7303 Prediabetes: Secondary | ICD-10-CM | POA: Diagnosis not present

## 2024-08-12 DIAGNOSIS — Z125 Encounter for screening for malignant neoplasm of prostate: Secondary | ICD-10-CM

## 2024-08-12 DIAGNOSIS — N529 Male erectile dysfunction, unspecified: Secondary | ICD-10-CM

## 2024-08-12 DIAGNOSIS — E7800 Pure hypercholesterolemia, unspecified: Secondary | ICD-10-CM

## 2024-08-12 DIAGNOSIS — I10 Essential (primary) hypertension: Secondary | ICD-10-CM | POA: Diagnosis not present

## 2024-08-12 DIAGNOSIS — Z0000 Encounter for general adult medical examination without abnormal findings: Secondary | ICD-10-CM

## 2024-08-12 MED ORDER — HYDROCHLOROTHIAZIDE 25 MG PO TABS: 25.0000 mg | ORAL_TABLET | Freq: Every day | ORAL | 3 refills | Status: AC

## 2024-08-12 MED ORDER — IRBESARTAN 300 MG PO TABS: 300.0000 mg | ORAL_TABLET | Freq: Every day | ORAL | 3 refills | Status: DC

## 2024-08-12 MED ORDER — IRBESARTAN 150 MG PO TABS: 150.0000 mg | ORAL_TABLET | Freq: Every day | ORAL | 3 refills | Status: AC

## 2024-08-12 MED ORDER — ROSUVASTATIN CALCIUM 20 MG PO TABS: 20.0000 mg | ORAL_TABLET | Freq: Every day | ORAL | 3 refills | Status: AC

## 2024-08-12 NOTE — Progress Notes (Signed)
 "     Office Note 08/12/2024  CC:  Chief Complaint  Patient presents with   Medical Management of Chronic Issues    Pt not fasting   Patient is a 49 y.o. male who is here for annual health maintenance exam and 20-month follow-up hypertension and hypercholesterolemia. A/P as of last visit: GERD, long-term Protonix  40 mg/day. Refilled #90 with 3 refills today.   Hypertension, hyperlipidemia, obesity--> doing very well.  Continue HCTZ 25 mg a day, irbesartan  300 mg daily, rosuvastatin  20 mg a day, and aspirin  81 mg a day.  INTERIM HX: Hunter Wiley is feeling well. He keeps physically fit an eats healthy diet.  No home blood pressure monitoring.  Past Medical History:  Diagnosis Date   Diarrhea 03/2015   GI eval (Dr. Burnette, [facility]) ?malabsorptive syndrome?  Work-up pending   Dislocation, coccyx closed    [facility]   Elevated coronary artery calcium  score 12/03/2021   99th %'tile->rosuvastatin  started   Elevated transaminase level 2017, 2019   Very mild ALT elev: Hep serologies neg. hepatic steatosis noted on CT cardiac calcium  score 12/2021.   GERD (gastroesophageal reflux disease)    Gout    R MTP jt   Hepatic steatosis    History of Salmonella gastroenteritis 04/2021   HTN (hypertension)    Hyperlipidemia    Nephrolithiasis    Nonexertional chest pain    Ruled out in ED 09/2018.  Isolated occurence.   Obesity, Class II, BMI 35-39.9    Vasomotor rhinitis     Past Surgical History:  Procedure Laterality Date   COLONOSCOPY     06/24/22 NORMAL. Recall 25yr   Coronary calcium  score  12/03/2021   99th %'tile (LAD and RCA)   EYE SURGERY     Lasik   TONSILLECTOMY AND ADENOIDECTOMY  08/04/1978   VASECTOMY  08/04/2012   WISDOM TOOTH EXTRACTION      Family History  Problem Relation Age of Onset   Hypertension Mother    Diabetes Mother    Heart attack Mother        Normal coronaries @ [facility]   Colon polyps Father    Hypertension Father    Lung cancer Maternal Grandfather         mesothelioma   Heart attack Paternal Grandfather        in 64s   Esophageal cancer Neg Hx    Stomach cancer Neg Hx    Rectal cancer Neg Hx    Colon cancer Neg Hx     Social History   Socioeconomic History   Marital status: Married    Spouse name: Not on file   Number of children: Not on file   Years of education: Not on file   Highest education level: Not on file  Occupational History   Not on file  Tobacco Use   Smoking status: Never   Smokeless tobacco: Never  Vaping Use   Vaping status: Never Used  Substance and Sexual Activity   Alcohol use: No   Drug use: Yes    Types: Marijuana    Comment: edibles-last weekend   Sexual activity: Not on file  Other Topics Concern   Not on file  Social History Narrative   Married, 2 sons.   Occupation: designer, [employer].   Lives in [place].   No T/A/Ds.   No FH of prostate or colon cancer   Social Drivers of Health   Tobacco Use: Low Risk (08/12/2024)   Patient History  Smoking Tobacco Use: Never    Smokeless Tobacco Use: Never    Passive Exposure: Not on file  Financial Resource Strain: Not on file  Food Insecurity: Not on file  Transportation Needs: Not on file  Physical Activity: Not on file  Stress: Not on file  Social Connections: Unknown (12/13/2021)   [facility]   Social Network    Social Network: Not on file  Intimate Partner Violence: Unknown (11/04/2021)   Received from [facility]   HITS    Physically Hurt: Not on file    Insult or Talk Down To: Not on file    Threaten Physical Harm: Not on file    Scream or Curse: Not on file  Depression (PHQ2-9): Low Risk (08/12/2024)   Depression (PHQ2-9)    PHQ-2 Score: 0  Alcohol Screen: Not on file  Housing: Not on file  Utilities: Not on file  Health Literacy: Not on file    Outpatient Medications Prior to Visit  Medication Sig Dispense Refill   allopurinol  (ZYLOPRIM ) 300 MG tablet Take 1 tablet (300 mg total) by mouth daily.  90 tablet 3   aspirin  EC 81 MG tablet Take 81 mg by mouth daily.     pantoprazole  (PROTONIX ) 40 MG tablet Take 1 tablet (40 mg total) by mouth daily. 90 tablet 3   sildenafil  (VIAGRA ) 50 MG tablet 1-2 tabs po qd prn intercourse 5 tablet 5   diclofenac (VOLTAREN) 75 MG EC tablet Take 75 mg by mouth 2 (two) times daily. (Patient not taking: Reported on 08/12/2024)     hydrochlorothiazide  (HYDRODIURIL ) 25 MG tablet Take 1 tablet (25 mg total) by mouth daily. OFFICE VISIT NEEDED FOR FURTHER REFILLS 30 tablet 0   irbesartan  (AVAPRO ) 300 MG tablet Take 1 tablet (300 mg total) by mouth daily. OFFICE VISIT NEEDED FOR FURTHER REFILLS 30 tablet 0   rosuvastatin  (CRESTOR ) 20 MG tablet Take 1 tablet (20 mg total) by mouth daily. OFFICE VISIT NEEDED FOR FURTHER REFILLS 30 tablet 0   No facility-administered medications prior to visit.    Allergies[1]  Review of Systems  Constitutional:  Negative for appetite change, chills, fatigue and fever.  HENT:  Negative for congestion, dental problem, ear pain and sore throat.   Eyes:  Negative for discharge, redness and visual disturbance.  Respiratory:  Negative for cough, chest tightness, shortness of breath and wheezing.   Cardiovascular:  Negative for chest pain, palpitations and leg swelling.  Gastrointestinal:  Negative for abdominal pain, blood in stool, diarrhea, nausea and vomiting.  Genitourinary:  Negative for difficulty urinating, dysuria, flank pain, frequency, hematuria and urgency.  Musculoskeletal:  Negative for arthralgias, back pain, joint swelling, myalgias and neck stiffness.  Skin:  Negative for pallor and rash.  Neurological:  Negative for dizziness, speech difficulty, weakness and headaches.  Hematological:  Negative for adenopathy. Does not bruise/bleed easily.  Psychiatric/Behavioral:  Negative for confusion and sleep disturbance. The patient is not nervous/anxious.     PE;    08/12/2024    3:53 PM 12/24/2023    3:05 PM 06/05/2023    10:08 AM  Vitals with BMI  Height 6' 0 6' 0   Weight  239 lbs 10 oz 257 lbs 13 oz  BMI  32.49   Systolic 99 110 125  Diastolic 66 70 84  Pulse 82 89 78   Gen: Alert, well appearing.  Patient is oriented to person, place, time, and situation. AFFECT: pleasant, lucid thought and speech. ENT: Ears: EACs clear,  normal epithelium.  TMs with good light reflex and landmarks bilaterally.  Eyes: no injection, icteris, swelling, or exudate.  EOMI, PERRLA. Nose: no drainage or turbinate edema/swelling.  No injection or focal lesion.  Mouth: lips without lesion/swelling.  Oral mucosa pink and moist.  Dentition intact and without obvious caries or gingival swelling.  Oropharynx without erythema, exudate, or swelling.  Neck: supple/nontender.  No LAD, mass, or TM.  Carotid pulses 2+ bilaterally, without bruits. CV: RRR, no m/r/g.   LUNGS: CTA bilat, nonlabored resps, good aeration in all lung fields. ABD: soft, NT, ND, BS normal.  No hepatospenomegaly or mass.  No bruits. EXT: no clubbing, cyanosis, or edema.  Musculoskeletal: no joint swelling, erythema, warmth, or tenderness.  ROM of all joints intact. Skin - no sores or suspicious lesions or rashes or color changes  Pertinent labs:  Lab Results  Component Value Date   TSH 1.76 12/03/2022   Lab Results  Component Value Date   WBC 7.1 12/03/2022   HGB 15.4 12/03/2022   HCT 45.4 12/03/2022   MCV 87.4 12/03/2022   PLT 177.0 12/03/2022   Lab Results  Component Value Date   CREATININE 1.12 06/05/2023   BUN 16 06/05/2023   NA 141 06/05/2023   K 3.8 06/05/2023   CL 102 06/05/2023   CO2 32 06/05/2023   Lab Results  Component Value Date   ALT 39 06/05/2023   AST 28 06/05/2023   ALKPHOS 47 06/05/2023   BILITOT 1.0 06/05/2023   Lab Results  Component Value Date   CHOL 127 12/03/2022   Lab Results  Component Value Date   HDL 41.60 12/03/2022   Lab Results  Component Value Date   LDLCALC 66 12/03/2022   Lab Results  Component  Value Date   TRIG 97.0 12/03/2022   Lab Results  Component Value Date   CHOLHDL 3 12/03/2022   Lab Results  Component Value Date   HGBA1C 5.7 06/05/2023   ASSESSMENT AND PLAN:   #1 Health maintenance exam: Reviewed age and gender appropriate health maintenance issues (prudent diet, regular exercise, health risks of tobacco and excessive alcohol, use of seatbelts, fire alarms in home, use of sunscreen).  Also reviewed age and gender appropriate health screening as well as vaccine recommendations. Vaccines: Prevnar 20-->deferred.  Shingrix-->deferred.  Labs: fasting HP labs + Hba1c (prediabetes) ordered future.  He requested testosterone  level due to mild ED. Prostate ca screening: PSA ordered future. Colon ca screening: Recall 2033.  #2 hypertension. Blood pressure is a little low here today and was soft at his last follow-up 6 months ago.  He is asymptomatic. Will go ahead and decrease his irbesartan  to 150 mg a day.  Continue HCTZ 25 mg a day.  3.  Hypercholesterolemia, doing well on rosuvastatin  20 mg a day. Return for fasting lipid panel and hepatic panel.  An After Visit Summary was printed and given to the patient.  FOLLOW UP:  Return in about 6 months (around 02/09/2025) for routine chronic illness f/u.  Signed:  Gerlene Hockey, MD           08/12/2024     [1]  Allergies Allergen Reactions   Lotrel [Amlodipine  Besy-Benazepril  Hcl] Other (See Comments) and Cough    Cough + sudden periods of severe fatigue   "

## 2024-08-12 NOTE — Patient Instructions (Signed)
 Health Maintenance, Male  Adopting a healthy lifestyle and getting preventive care are important in promoting health and wellness. Ask your health care provider about:  The right schedule for you to have regular tests and exams.  Things you can do on your own to prevent diseases and keep yourself healthy.  What should I know about diet, weight, and exercise?  Eat a healthy diet    Eat a diet that includes plenty of vegetables, fruits, low-fat dairy products, and lean protein.  Do not eat a lot of foods that are high in solid fats, added sugars, or sodium.  Maintain a healthy weight  Body mass index (BMI) is a measurement that can be used to identify possible weight problems. It estimates body fat based on height and weight. Your health care provider can help determine your BMI and help you achieve or maintain a healthy weight.  Get regular exercise  Get regular exercise. This is one of the most important things you can do for your health. Most adults should:  Exercise for at least 150 minutes each week. The exercise should increase your heart rate and make you sweat (moderate-intensity exercise).  Do strengthening exercises at least twice a week. This is in addition to the moderate-intensity exercise.  Spend less time sitting. Even light physical activity can be beneficial.  Watch cholesterol and blood lipids  Have your blood tested for lipids and cholesterol at 51 years of age, then have this test every 5 years.  You may need to have your cholesterol levels checked more often if:  Your lipid or cholesterol levels are high.  You are older than 51 years of age.  You are at high risk for heart disease.  What should I know about cancer screening?  Many types of cancers can be detected early and may often be prevented. Depending on your health history and family history, you may need to have cancer screening at various ages. This may include screening for:  Colorectal cancer.  Prostate cancer.  Skin cancer.  Lung  cancer.  What should I know about heart disease, diabetes, and high blood pressure?  Blood pressure and heart disease  High blood pressure causes heart disease and increases the risk of stroke. This is more likely to develop in people who have high blood pressure readings or are overweight.  Talk with your health care provider about your target blood pressure readings.  Have your blood pressure checked:  Every 3-5 years if you are 24-52 years of age.  Every year if you are 3 years old or older.  If you are between the ages of 60 and 72 and are a current or former smoker, ask your health care provider if you should have a one-time screening for abdominal aortic aneurysm (AAA).  Diabetes  Have regular diabetes screenings. This checks your fasting blood sugar level. Have the screening done:  Once every three years after age 66 if you are at a normal weight and have a low risk for diabetes.  More often and at a younger age if you are overweight or have a high risk for diabetes.  What should I know about preventing infection?  Hepatitis B  If you have a higher risk for hepatitis B, you should be screened for this virus. Talk with your health care provider to find out if you are at risk for hepatitis B infection.  Hepatitis C  Blood testing is recommended for:  Everyone born from [birth year] through 1967.  Anyone  with known risk factors for hepatitis C.  Sexually transmitted infections (STIs)  You should be screened each year for STIs, including gonorrhea and chlamydia, if:  You are sexually active and are younger than 51 years of age.  You are older than 51 years of age and your health care provider tells you that you are at risk for this type of infection.  Your sexual activity has changed since you were last screened, and you are at increased risk for chlamydia or gonorrhea. Ask your health care provider if you are at risk.  Ask your health care provider about whether you are at high risk for HIV. Your health care provider  may recommend a prescription medicine to help prevent HIV infection. If you choose to take medicine to prevent HIV, you should first get tested for HIV. You should then be tested every 3 months for as long as you are taking the medicine.  Follow these instructions at home:  Alcohol use  Do not drink alcohol if your health care provider tells you not to drink.  If you drink alcohol:  Limit how much you have to 0-2 drinks a day.  Know how much alcohol is in your drink. In the [place] one drink equals one 12 oz bottle of beer (355 mL), one 5 oz glass of wine (148 mL), or one 1 oz glass of hard liquor (44 mL).  Lifestyle  Do not use any products that contain nicotine or tobacco. These products include cigarettes, chewing tobacco, and vaping devices, such as e-cigarettes. If you need help quitting, ask your health care provider.  Do not use street drugs.  Do not share needles.  Ask your health care provider for help if you need support or information about quitting drugs.  General instructions  Schedule regular health, dental, and eye exams.  Stay current with your vaccines.  Tell your health care provider if:  You often feel depressed.  You have ever been abused or do not feel safe at home.  Summary  Adopting a healthy lifestyle and getting preventive care are important in promoting health and wellness.  Follow your health care provider's instructions about healthy diet, exercising, and getting tested or screened for diseases.  Follow your health care provider's instructions on monitoring your cholesterol and blood pressure.  This information is not intended to replace advice given to you by your health care provider. Make sure you discuss any questions you have with your health care provider.  Document Revised: 12/10/2020 Document Reviewed: 12/10/2020  Elsevier Patient Education  2024 [employer].

## 2024-08-15 ENCOUNTER — Other Ambulatory Visit

## 2024-08-15 DIAGNOSIS — R7303 Prediabetes: Secondary | ICD-10-CM

## 2024-08-15 DIAGNOSIS — N529 Male erectile dysfunction, unspecified: Secondary | ICD-10-CM | POA: Diagnosis not present

## 2024-08-15 DIAGNOSIS — I10 Essential (primary) hypertension: Secondary | ICD-10-CM

## 2024-08-15 DIAGNOSIS — E7800 Pure hypercholesterolemia, unspecified: Secondary | ICD-10-CM

## 2024-08-15 DIAGNOSIS — Z125 Encounter for screening for malignant neoplasm of prostate: Secondary | ICD-10-CM

## 2024-08-16 ENCOUNTER — Ambulatory Visit: Payer: Self-pay | Admitting: Family Medicine

## 2024-08-16 LAB — LIPID PANEL
Cholesterol: 112 mg/dL
HDL: 44 mg/dL
LDL Cholesterol (Calc): 52 mg/dL
Non-HDL Cholesterol (Calc): 68 mg/dL
Total CHOL/HDL Ratio: 2.5 (calc)
Triglycerides: 77 mg/dL

## 2024-08-16 LAB — CBC WITH DIFFERENTIAL/PLATELET
Absolute Lymphocytes: 1490 {cells}/uL (ref 850–3900)
Absolute Monocytes: 648 {cells}/uL (ref 200–950)
Basophils Absolute: 32 {cells}/uL (ref 0–200)
Basophils Relative: 0.4 %
Eosinophils Absolute: 259 {cells}/uL (ref 15–500)
Eosinophils Relative: 3.2 %
HCT: 46.8 % (ref 39.4–51.1)
Hemoglobin: 15.7 g/dL (ref 13.2–17.1)
MCH: 29.5 pg (ref 27.0–33.0)
MCHC: 33.5 g/dL (ref 31.6–35.4)
MCV: 87.8 fL (ref 81.4–101.7)
MPV: 11.4 fL (ref 7.5–12.5)
Monocytes Relative: 8 %
Neutro Abs: 5670 {cells}/uL (ref 1500–7800)
Neutrophils Relative %: 70 %
Platelets: 188 Thousand/uL (ref 140–400)
RBC: 5.33 Million/uL (ref 4.20–5.80)
RDW: 12.7 % (ref 11.0–15.0)
Total Lymphocyte: 18.4 %
WBC: 8.1 Thousand/uL (ref 3.8–10.8)

## 2024-08-16 LAB — COMPREHENSIVE METABOLIC PANEL WITH GFR
AG Ratio: 1.9 (calc) (ref 1.0–2.5)
ALT: 27 U/L (ref 9–46)
AST: 21 U/L (ref 10–35)
Albumin: 4.6 g/dL (ref 3.6–5.1)
Alkaline phosphatase (APISO): 52 U/L (ref 35–144)
BUN: 15 mg/dL (ref 7–25)
CO2: 31 mmol/L (ref 20–32)
Calcium: 9.7 mg/dL (ref 8.6–10.3)
Chloride: 102 mmol/L (ref 98–110)
Creat: 1.02 mg/dL (ref 0.70–1.30)
Globulin: 2.4 g/dL (ref 1.9–3.7)
Glucose, Bld: 94 mg/dL (ref 65–99)
Potassium: 4.1 mmol/L (ref 3.5–5.3)
Sodium: 141 mmol/L (ref 135–146)
Total Bilirubin: 1.3 mg/dL — ABNORMAL HIGH (ref 0.2–1.2)
Total Protein: 7 g/dL (ref 6.1–8.1)
eGFR: 90 mL/min/1.73m2

## 2024-08-16 LAB — PSA: PSA: 0.43 ng/mL

## 2024-08-16 LAB — TESTOSTERONE: Testosterone: 439 ng/dL (ref 250–827)

## 2024-08-16 LAB — HEMOGLOBIN A1C
Hgb A1c MFr Bld: 5.1 %
Mean Plasma Glucose: 100 mg/dL
eAG (mmol/L): 5.5 mmol/L

## 2024-08-16 LAB — TSH: TSH: 2.7 m[IU]/L (ref 0.40–4.50)

## 2025-02-13 ENCOUNTER — Ambulatory Visit: Admitting: Family Medicine
